# Patient Record
Sex: Male | Born: 1955 | Race: White | Hispanic: No
Health system: Midwestern US, Community
[De-identification: ages and names within clinical notes are randomized; demographics above are authoritative.]

## PROBLEM LIST (undated history)

## (undated) DIAGNOSIS — R739 Hyperglycemia, unspecified: Secondary | ICD-10-CM

---

## 2014-09-17 ENCOUNTER — Ambulatory Visit
Admit: 2014-09-17 | Discharge: 2014-09-17 | Payer: PRIVATE HEALTH INSURANCE | Attending: Physician Assistant | Primary: Physician Assistant

## 2014-09-17 DIAGNOSIS — I10 Essential (primary) hypertension: Secondary | ICD-10-CM

## 2014-09-17 MED ORDER — LOSARTAN POTASSIUM 25 MG PO TABS
25 MG | ORAL_TABLET | Freq: Every day | ORAL | 0 refills | Status: DC
Start: 2014-09-17 — End: 2014-10-22

## 2014-09-17 MED ORDER — BLOOD PRESSURE KIT
PACK | Freq: Every day | 0 refills | Status: AC
Start: 2014-09-17 — End: ?

## 2014-09-17 NOTE — Progress Notes (Signed)
[place]  [place] [address]  Dept: [phone]  Dept Fax: [phone]    Isaac Henson is a 59 y.o. male who presents today for his medical conditions/complaints as noted below.  Isaac Henson is c/o of   Chief Complaint   Patient presents with   ??? Hypertension   ??? Establish Care     new patient     Have you changed or stopped any medications since your last visit including any over-the-counter medicines, vitamins, or herbal medicines? no     Are you taking all your prescribed medications? No          If NO, why? -  Not taking any    Have you seen any other physician or provider since your last visit no    Have you had any other diagnostic tests since your last visit? no    Tobacco use:  Patient  reports that he has been smoking.  He has a 30.00 pack-year smoking history. He does not have any smokeless tobacco history on file.   If a smoker, cessation materials provided? NA   1-800-QUIT-NOW 5165463233)     Medical history Review  Past Medical, Family, and Social History reviewed and does contribute to the patient presenting condition    There are no preventive care reminders to display for this patient.      HPI:     Hypertension   This is a new problem. The current episode started more than 1 month ago. The problem is unchanged. The problem is uncontrolled. Associated symptoms include headaches. Pertinent negatives include no anxiety, blurred vision, chest pain, malaise/fatigue, neck pain, palpitations, peripheral edema, PND, shortness of breath or sweats. Past treatments include nothing.       No results found for: LABA1C          ( goal A1C is < 7)   No results found for: LABMICR  No results found for: LDLCHOLESTEROL, LDLCALC    (goal LDL is <100)   No results found for: AST, ALT, BUN  BP Readings from Last 3 Encounters:   09/17/14 (!) 161/110          (goal 120/80)    History reviewed. No pertinent past medical history.    Past Surgical History   Procedure Laterality Date   ??? Appendectomy         Family History   Problem Relation Age of Onset   ??? Asthma Mother    ??? Other Mother    ??? Diabetes Mother    ??? High Blood Pressure Mother    ??? Heart Disease Paternal Grandfather        Social History   Substance Use Topics   ??? Smoking status: Current Every Day Smoker     Packs/day: 1.00     Years: 30.00   ??? Smokeless tobacco: Not on file   ??? Alcohol use Yes      Comment: bottle of liquor a week      Current Outpatient Prescriptions   Medication Sig Dispense Refill   ??? losartan (COZAAR) 25 MG tablet Take 1 tablet by mouth daily 30 tablet 0   ??? Blood Pressure KIT 1 Device by Does not apply route daily 1 kit 0     No current facility-administered medications for this visit.      No Known Allergies    Health Maintenance   Topic Date Due   ???  COLON CANCER SCREENING COLONOSCOPY  11/16/2014 (Originally 11/23/2007)   ??? LIPIDS  11/22/2014 (Originally 11/22/1997)   ??? Tetanus (DTaP/Tdap/Td) (1 - Tdap) 11/22/2014 (Originally 11/22/1976)   ??? Flu Vaccine (1) 11/22/2014 (Originally 09/10/2014)   ??? DIABETES SCREENING  11/23/2014 (Originally 11/22/1997)   ??? PNEUMOVAX 1 DOSE 19-64Y (1) 11/29/2014 (Originally 11/23/1975)   ??? HIV SCREENING  11/29/2014 (Originally 11/22/1972)   ??? HEPATITIS  C SCREENING  11/30/2014 (Originally 05-25-1955)       Subjective:      Review of Systems   Constitutional: Negative for chills, diaphoresis, fatigue, fever and malaise/fatigue.   HENT: Negative for congestion, ear discharge, ear pain, postnasal drip, rhinorrhea, sinus pressure and sore throat.    Eyes: Negative for blurred vision, discharge and itching.   Respiratory: Negative for cough, chest tightness and shortness of breath.    Cardiovascular: Negative for chest pain, palpitations, leg swelling and PND.   Gastrointestinal: Negative for abdominal pain, diarrhea, nausea and vomiting.   Genitourinary: Negative for dysuria and frequency.   Musculoskeletal: Negative for neck pain  and neck stiffness.   Skin: Negative for rash.   Neurological: Positive for headaches. Negative for dizziness, weakness, light-headedness and numbness.   All other systems reviewed and are negative.      Objective:     Physical Exam   Constitutional: He is oriented to person, place, and time. He appears well-developed and well-nourished. No distress.   BP (!) 161/110  Pulse 96  Temp 99.1 ??F (37.3 ??C) (Oral)   Ht 6' (1.829 m) Comment: per pt  Wt 259 lb 14.4 oz (117.9 kg)  SpO2 96%  BMI 35.25 kg/m2     HENT:   Head: Normocephalic and atraumatic.   Right Ear: External ear normal.   Left Ear: External ear normal.   Nose: Nose normal.   Mouth/Throat: Oropharynx is clear and moist.   Eyes: Conjunctivae and EOM are normal. Pupils are equal, round, and reactive to light. Right eye exhibits no discharge. Left eye exhibits no discharge. No scleral icterus.   Neck: Normal range of motion. Neck supple. No tracheal deviation present. No thyromegaly present.   Cardiovascular: Normal rate, regular rhythm and normal heart sounds.  Exam reveals no gallop and no friction rub.    No murmur heard.  Pulmonary/Chest: Effort normal and breath sounds normal. No stridor. No respiratory distress. He has no wheezes. He has no rales. He exhibits no tenderness.   Abdominal: Soft. Bowel sounds are normal. He exhibits no distension. There is no tenderness. There is no rebound and no guarding.   Musculoskeletal: He exhibits no edema.   Neurological: He is alert and oriented to person, place, and time. Gait normal.   Skin: Skin is warm and dry. No rash noted. He is not diaphoretic.   Psychiatric: He has a normal mood and affect. His affect is not inappropriate.   Nursing note and vitals reviewed.    Visit Vitals   ??? BP (!) 161/110   ??? Pulse 96   ??? Temp 99.1 ??F (37.3 ??C) (Oral)   ??? Ht 6' (1.829 m)  Comment: per pt   ??? Wt 259 lb 14.4 oz (117.9 kg)   ??? SpO2 96%   ??? BMI 35.25 kg/m2       Assessment:      1. Uncontrolled hypertension  CBC Auto  Differential    Comprehensive Metabolic Panel    Lipid Panel    TSH with Reflex    Insulin, Total  XR Chest Standard TWO VW   2. BMI 35.0-35.9,adult  CBC Auto Differential    Comprehensive Metabolic Panel    Lipid Panel    TSH with Reflex    Insulin, Total   3. Screening  CBC Auto Differential    Comprehensive Metabolic Panel    Lipid Panel    TSH with Reflex    Insulin, Total   4. Family history of diabetes mellitus  CBC Auto Differential    Comprehensive Metabolic Panel    Lipid Panel    TSH with Reflex    Insulin, Total   5. Weight gain  CBC Auto Differential    Comprehensive Metabolic Panel    Lipid Panel    TSH with Reflex    Insulin, Total   6. Screening for prostate cancer  PSA Screening   7. Screening for colon cancer  AFL Derrel Nip MD- [place]field Cornea, MD   8. Tobacco abuse  XR Chest Standard TWO VW             Plan:      No Follow-up on file.    Orders Placed This Encounter   Procedures   ??? XR Chest Standard TWO VW     Standing Status:   Future     Standing Expiration Date:   09/17/2015   ??? Pneumococcal conjugate vaccine 13-valent IM (PREVNAR 13)   ??? CBC Auto Differential     Standing Status:   Future     Standing Expiration Date:   09/17/2015   ??? Comprehensive Metabolic Panel     Standing Status:   Future     Standing Expiration Date:   09/17/2015   ??? Lipid Panel     Standing Status:   Future     Standing Expiration Date:   09/17/2015     Order Specific Question:   Is Patient Fasting?/# of Hours     Answer:   yes   ??? TSH with Reflex     Standing Status:   Future     Standing Expiration Date:   09/17/2015   ??? Insulin, Total     Standing Status:   Future     Standing Expiration Date:   09/17/2015   ??? PSA Screening     Standing Status:   Future     Standing Expiration Date:   09/17/2015   ??? AFL Derrel Nip MD- [place] Cornea, MD     Referral Priority:   Routine     Referral Type:   Consult for Advice and Opinion     Referral Reason:   Specialty Services Required     Referred to Provider:   Derrel Nip, MD      Requested Specialty:   General Surgery     Number of Visits Requested:   1     Orders Placed This Encounter   Medications   ??? losartan (COZAAR) 25 MG tablet     Sig: Take 1 tablet by mouth daily     Dispense:  30 tablet     Refill:  0   ??? Blood Pressure KIT     Sig: 1 Device by Does not apply route daily     Dispense:  1 kit     Refill:  0     Adult upper arm automatic.    Dx: Uncontrolled HTN      Prefers to be called Ray.  Here from [place].  Caring for mother with dementia.  HTN - No hx of.  BP high today and at visit with dentist.      Weight gain - States not as active.  22 lbs over the last year.  Diet and exercise encouraged. Labs     Tobacco abuse - 1ppd x 27 years.  Understands risks.  Cxr ordered.    Colonoscopy - ordered.    Balanced diet and routine exercise encouraged.    Multivitamin with vitamin D daily encouraged.    Good sleep hygiene encouraged.    Dental exam q 6 mo.    Vision yearly.    Sunscreen encouraged.    Immunizations reviewed.    Prevnar 13 ordered.    Tetanus - Needs, but we are out of.     Patient given educational materials - see patient instructions.  Discussed use, benefit, and side effects of prescribed medications.  All patient questions answered. Pt voiced understanding. Reviewed health maintenance.  Instructed to continue current medications, diet and exercise.  Patient agreed with treatment plan. Follow up as directed.     Electronically signed by Sindy Messing, PA-C on 09/17/2014 at 2:12 PM

## 2014-09-17 NOTE — Progress Notes (Signed)
After obtaining consent, and per orders of Dr. Colbert EwingBurgoon, injection of Prevnar 13 given in left deltiod by Carley HammedJessica Loney Domingo. Patient instructed to remain in clinic for 20 minutes afterwards, and to report any adverse reaction to me immediately. Patient did not have a reaction at this time.

## 2014-09-21 ENCOUNTER — Ambulatory Visit: Admit: 2014-09-21 | Discharge: 2014-11-13 | Payer: PRIVATE HEALTH INSURANCE | Primary: Physician Assistant

## 2014-09-21 ENCOUNTER — Inpatient Hospital Stay: Attending: Physician Assistant | Primary: Physician Assistant

## 2014-09-21 DIAGNOSIS — I10 Essential (primary) hypertension: Secondary | ICD-10-CM

## 2014-09-21 LAB — CBC WITH AUTO DIFFERENTIAL
Basophils %: 0 % (ref 0–2)
Basophils Absolute: 0 10*3/uL (ref 0.0–0.2)
Eosinophils %: 2 % (ref 1–4)
Eosinophils Absolute: 0.1 10*3/uL (ref 0.0–0.4)
Hematocrit: 46.6 % (ref 41–53)
Hemoglobin: 15.3 g/dL (ref 13.5–17.5)
Lymphocytes Absolute: 1.1 10*3/uL (ref 1.0–4.8)
Lymphocytes: 14 % — ABNORMAL LOW (ref 24–44)
MCH: 31.8 pg (ref 26–34)
MCHC: 32.9 g/dL (ref 31–37)
MCV: 96.8 fL (ref 80–100)
MPV: 9.5 fL (ref 6.0–12.0)
Monocytes %: 6 % (ref 2–11)
Monocytes Absolute: 0.4 10*3/uL (ref 0.1–1.2)
Neutrophils Absolute: 5.7 10*3/uL (ref 1.8–7.7)
Platelets: 162 10*3/uL (ref 140–450)
RBC: 4.82 m/uL (ref 4.5–5.9)
RDW: 13.7 % (ref 12.5–15.4)
Seg Neutrophils: 78 % — ABNORMAL HIGH (ref 36–66)
WBC: 7.3 10*3/uL (ref 3.5–11.0)

## 2014-09-21 LAB — LIPID PANEL
Chol/HDL Ratio: 5 — ABNORMAL HIGH (ref <5)
Cholesterol: 209 mg/dL — ABNORMAL HIGH (ref <200)
HDL: 42 mg/dL (ref >40)
LDL Cholesterol: 125 mg/dL (ref 0–130)
Triglycerides: 209 mg/dL — ABNORMAL HIGH (ref <150)

## 2014-09-21 LAB — COMPREHENSIVE METABOLIC PANEL
ALT: 28 U/L (ref 5–41)
AST: 22 U/L (ref <40)
Albumin/Globulin Ratio: 1.4 (ref 1.0–2.5)
Albumin: 4.2 g/dL (ref 3.5–5.2)
Alkaline Phosphatase: 85 U/L (ref 40–129)
Anion Gap: 16 mmol/L (ref 9–17)
BUN: 14 mg/dL (ref 6–20)
CO2: 24 mmol/L (ref 20–31)
Calcium: 9.2 mg/dL (ref 8.6–10.4)
Chloride: 100 mmol/L (ref 98–107)
Creatinine: 1 mg/dL (ref 0.70–1.20)
GFR African American: >60 mL/min (ref >60)
GFR Non-African American: >60 mL/min (ref >60)
Glucose: 162 mg/dL — ABNORMAL HIGH (ref 70–99)
Potassium: 4 mmol/L (ref 3.7–5.3)
Sodium: 140 mmol/L (ref 135–144)
Total Bilirubin: 0.32 mg/dL (ref 0.3–1.2)
Total Protein: 7.3 g/dL (ref 6.4–8.3)

## 2014-09-21 LAB — INSULIN, TOTAL: Insulin: 54.3 mU/L

## 2014-09-21 LAB — TSH WITH REFLEX: TSH: 1.75 mIU/L (ref 0.30–5.00)

## 2014-09-21 LAB — PSA SCREENING: PSA: 2.06 ug/L (ref <4.1)

## 2014-10-01 ENCOUNTER — Ambulatory Visit
Admit: 2014-10-01 | Discharge: 2014-10-01 | Payer: PRIVATE HEALTH INSURANCE | Attending: Physician Assistant | Primary: Physician Assistant

## 2014-10-01 DIAGNOSIS — I10 Essential (primary) hypertension: Secondary | ICD-10-CM

## 2014-10-01 LAB — POCT GLYCOSYLATED HEMOGLOBIN (HGB A1C): Hemoglobin A1C: 5.8 %

## 2014-10-01 MED ORDER — FREESTYLE SYSTEM KIT
PACK | Freq: Every day | 0 refills | Status: AC
Start: 2014-10-01 — End: ?

## 2014-10-01 MED ORDER — LANCET DEVICE MISC
11 refills | Status: AC
Start: 2014-10-01 — End: ?

## 2014-10-01 MED ORDER — METFORMIN HCL ER 500 MG PO TB24
500 MG | ORAL_TABLET | Freq: Every day | ORAL | 3 refills | Status: DC
Start: 2014-10-01 — End: 2014-11-16

## 2014-10-01 MED ORDER — GLUCOSE BLOOD VI STRP
ORAL_STRIP | 11 refills | Status: AC
Start: 2014-10-01 — End: ?

## 2014-10-01 MED ORDER — FREESTYLE SYSTEM KIT
PACK | Freq: Every day | 0 refills | Status: DC
Start: 2014-10-01 — End: 2014-10-01

## 2014-10-01 NOTE — Progress Notes (Signed)
[place]  [place] [address]  Dept: [phone]  Dept Fax: [phone]    Isaac Henson is a 59 y.o. male who presents today for his medical conditions/complaints as noted below.  Isaac Henson is c/o of   Chief Complaint   Patient presents with   ??? Medication Check   ??? Discuss Labs     Have you changed or stopped any medications since your last visit including any over-the-counter medicines, vitamins, or herbal medicines? no     Are you taking all your prescribed medications? Yes          If NO, why? -  N/A    Have you seen any other physician or provider since your last visit no    Have you had any other diagnostic tests since your last visit? no    Tobacco use:  Patient  reports that he has been smoking.  He has a 30.00 pack-year smoking history. He does not have any smokeless tobacco history on file.   If a smoker, cessation materials provided? NA   1-800-QUIT-NOW [phone])     Medical history Review  Past Medical, Family, and Social History reviewed and does contribute to the patient presenting condition    There are no preventive care reminders to display for this patient.      HPI:     Hypertension   This is a new problem. The current episode started 1 to 4 weeks ago. Pertinent negatives include no anxiety, blurred vision, chest pain, headaches, malaise/fatigue, neck pain, orthopnea, palpitations, peripheral edema, PND, shortness of breath or sweats.   Diabetes   He presents for his follow-up diabetic visit. There are no hypoglycemic associated symptoms. Pertinent negatives for hypoglycemia include no dizziness, headaches or sweats. There are no diabetic associated symptoms. Pertinent negatives for diabetes include no blurred vision, no chest pain, no fatigue and no weakness.       HEMOGLOBIN A1C (%)   Date Value   10/01/2014 5.8             ( goal A1C is < 7)   No results found for: LABMICR  LDL CHOLESTEROL (mg/dL)    Date Value   09/21/2014 125       (goal LDL is <100)   AST (U/L)   Date Value   09/21/2014 22     ALT (U/L)   Date Value   09/21/2014 28     BUN (mg/dL)   Date Value   09/21/2014 14     BP Readings from Last 3 Encounters:   10/01/14 138/86   09/17/14 (!) 161/110          (goal 120/80)    History reviewed. No pertinent past medical history.   Past Surgical History   Procedure Laterality Date   ??? Appendectomy         Family History   Problem Relation Age of Onset   ??? Asthma Mother    ??? Other Mother    ??? Diabetes Mother    ??? High Blood Pressure Mother    ??? Heart Disease Paternal Grandfather        Social History   Substance Use Topics   ??? Smoking status: Current Every Day Smoker     Packs/day: 1.00     Years: 30.00   ??? Smokeless tobacco: Not on file   ??? Alcohol use Yes      Comment:  bottle of liquor a week      Current Outpatient Prescriptions   Medication Sig Dispense Refill   ??? metFORMIN ER (GLUCOPHAGE-XR) 500 MG XR tablet Take 1 tablet by mouth daily (with breakfast) 30 tablet 3   ??? glucose blood VI test strips (ASCENSIA AUTODISC VI;ONE TOUCH ULTRA TEST VI) strip Use with associated glucose meter.    Dx: Hyperglycemia/insulin resistance 100 strip 11   ??? Lancet Device MISC 100 each by Does not apply route continuous May use any brand    Dx: Hyperglycemia/insulin resistance 100 each 11   ??? glucose monitoring kit (FREESTYLE) monitoring kit 1 kit by Does not apply route daily Insulin resistance 1 kit 0   ??? losartan (COZAAR) 25 MG tablet Take 1 tablet by mouth daily 30 tablet 0   ??? Blood Pressure KIT 1 Device by Does not apply route daily 1 kit 0     No current facility-administered medications for this visit.      No Known Allergies    Health Maintenance   Topic Date Due   ??? COLON CANCER SCREENING COLONOSCOPY  11/16/2014 (Originally 11/23/2007)   ??? Flu Vaccine (1) 11/22/2014 (Originally 09/10/2014)   ??? PNEUMOVAX 1 DOSE 19-64Y (1) 11/29/2014 (Originally 11/23/1975)   ??? HIV SCREENING  11/29/2014 (Originally 11/22/1972)    ??? HEPATITIS  C SCREENING  11/30/2014 (Originally 1955-05-24)   ??? DIABETES SCREENING  09/30/2017   ??? LIPIDS  09/21/2019   ??? Tetanus (DTaP/Tdap/Td) (2 - Td) 09/30/2024       Subjective:      Review of Systems   Constitutional: Negative for chills, diaphoresis, fatigue, fever and malaise/fatigue.   HENT: Negative for congestion, ear discharge, ear pain, postnasal drip, rhinorrhea, sinus pressure and sore throat.    Eyes: Negative for blurred vision, discharge and itching.   Respiratory: Negative for cough, chest tightness and shortness of breath.    Cardiovascular: Negative for chest pain, palpitations, orthopnea, leg swelling and PND.   Gastrointestinal: Negative for abdominal pain, diarrhea, nausea and vomiting.   Genitourinary: Negative for dysuria and frequency.   Musculoskeletal: Negative for neck pain and neck stiffness.   Skin: Negative for rash.   Neurological: Negative for dizziness, weakness, light-headedness, numbness and headaches.   All other systems reviewed and are negative.      Objective:     Physical Exam   Constitutional: He is oriented to person, place, and time. He appears well-developed and well-nourished. No distress.   BP 138/86  Pulse 83  Temp 98.4 ??F (36.9 ??C) (Oral)   Ht 6' (1.829 m) Comment: per pt  Wt 260 lb 14.4 oz (118.3 kg)  SpO2 97%  BMI 35.38 kg/m2     HENT:   Head: Normocephalic and atraumatic.   Right Ear: External ear normal.   Left Ear: External ear normal.   Nose: Nose normal.   Mouth/Throat: Oropharynx is clear and moist.   Eyes: Conjunctivae and EOM are normal. Pupils are equal, round, and reactive to light. Right eye exhibits no discharge. Left eye exhibits no discharge. No scleral icterus.   Neck: Normal range of motion. Neck supple. No tracheal deviation present. No thyromegaly present.   Cardiovascular: Normal rate, regular rhythm and normal heart sounds.  Exam reveals no gallop and no friction rub.    No murmur heard.  Pulmonary/Chest: Effort normal and breath sounds  normal. No stridor. No respiratory distress. He has no wheezes. He has no rales. He exhibits no tenderness.   Abdominal: Soft. Bowel  sounds are normal. He exhibits no distension. There is no tenderness. There is no rebound and no guarding.   Musculoskeletal: He exhibits no edema.   Neurological: He is alert and oriented to person, place, and time. Gait normal.   Skin: Skin is warm and dry. No rash noted. He is not diaphoretic.   Psychiatric: He has a normal mood and affect. His affect is not inappropriate.   Nursing note and vitals reviewed.    Visit Vitals   ??? BP 138/86   ??? Pulse 83   ??? Temp 98.4 ??F (36.9 ??C) (Oral)   ??? Ht 6' (1.829 m)  Comment: per pt   ??? Wt 260 lb 14.4 oz (118.3 kg)   ??? SpO2 97%   ??? BMI 35.38 kg/m2       Assessment:      1. Uncontrolled hypertension     2. BMI 35.0-35.9,adult     3. Hyperglycemia  POCT glycosylated hemoglobin (Hb A1C)    Internal Referral Diabetes Education - [employer]   4. Need for diphtheria-tetanus-pertussis (Tdap) vaccine     5. Insulin resistance     6. Tobacco abuse  CT Chest W Contrast   7. Abnormal x-ray  CT Chest W Contrast             Plan:      No Follow-up on file.    Orders Placed This Encounter   Procedures   ??? CT Chest W Contrast     Standing Status:   Future     Standing Expiration Date:   10/01/2015   ??? Tdap vaccine greater than or equal to 7yo IM   ??? Internal Referral Diabetes Education - [facility]     Referral Priority:   Routine     Referral Type:   Consult for Advice and Opinion     Referral Reason:   Specialty Services Required     Number of Visits Requested:   1   ??? POCT glycosylated hemoglobin (Hb A1C)     Orders Placed This Encounter   Medications   ??? metFORMIN ER (GLUCOPHAGE-XR) 500 MG XR tablet     Sig: Take 1 tablet by mouth daily (with breakfast)     Dispense:  30 tablet     Refill:  3   ??? glucose blood VI test strips (ASCENSIA AUTODISC VI;ONE TOUCH ULTRA TEST VI) strip     Sig: Use with associated glucose meter.    Dx: Hyperglycemia/insulin  resistance     Dispense:  100 strip     Refill:  11   ??? Lancet Device MISC     Sig: 100 each by Does not apply route continuous May use any brand    Dx: Hyperglycemia/insulin resistance     Dispense:  100 each     Refill:  11   ??? DISCONTD: glucose monitoring kit (FREESTYLE) monitoring kit     Sig: 1 kit by Does not apply route daily Dx: Hyperglycemia/insulin resistance     Dispense:  1 kit     Refill:  0   ??? glucose monitoring kit (FREESTYLE) monitoring kit     Sig: 1 kit by Does not apply route daily Insulin resistance     Dispense:  1 kit     Refill:  0     Prefers to be called Ray. Here from [place]. Caring for mother with dementia.  ??  Labs from 09/21/14 reviewed.    HTN - Taking Cozaar and tolerating  well.  Cont med.  ??  Hyperglycemia/insulin resistance - BS 162, insulin 54, A1C 5.8.  Diet and exercise encouraged.  Check BS.  Start metformin.  SE discussed.  Add statin next visit.  DM supplies ordered.    Weight gain - States not as active. 22 lbs over the last year. Diet and exercise encouraged. Labs   ??  Tobacco abuse - 1ppd x 27 years. Understands risks. Cxr 09/21/14 shows nonspecific linear opacity at left lung base likely atelectasis scarring with other etiologies not excluded.  CT chest ordered.  ??  Colonoscopy - Scheduled.  ??  Balanced diet and routine exercise encouraged.  ??  Multivitamin with vitamin D daily encouraged.  ??  Good sleep hygiene encouraged.  ??  Dental exam q 6 mo.  ??  Vision yearly.  ??  Sunscreen encouraged.  ??  Immunizations reviewed.  ??  Prevnar 13 09-2014  ??  Tetanus - 09/2014      Patient given educational materials - see patient instructions.  Discussed use, benefit, and side effects of prescribed medications.  All patient questions answered. Pt voiced understanding. Reviewed health maintenance.  Instructed to continue current medications, diet and exercise.  Patient agreed with treatment plan. Follow up as directed.     Electronically signed by Sindy Messing, PA-C on 10/02/2014 at  9:39 PM

## 2014-10-18 NOTE — Telephone Encounter (Signed)
Patient needs a refill on his blood pressure medicine but he says that his blood pressure is still running high, he was wondering if he could get something stronger. Please advise.

## 2014-10-19 NOTE — Telephone Encounter (Signed)
Take 2 cozaar  then please schedule f/u if appt not already established

## 2014-10-19 NOTE — Telephone Encounter (Signed)
LVM ASKING PT TO CALL THE OFFICE BACK TO INFORM

## 2014-10-19 NOTE — Telephone Encounter (Signed)
Pt informed and he said that he is almost out of his medication

## 2014-10-22 MED ORDER — LOSARTAN POTASSIUM 50 MG PO TABS
50 MG | ORAL_TABLET | Freq: Every day | ORAL | 1 refills | Status: DC
Start: 2014-10-22 — End: 2014-11-16

## 2014-10-22 NOTE — Telephone Encounter (Signed)
Health Maintenance   Topic Date Due   ??? COLON CANCER SCREENING COLONOSCOPY  11/16/2014 (Originally 11/23/2007)   ??? Flu Vaccine (1) 11/22/2014 (Originally 09/10/2014)   ??? PNEUMOVAX 1 DOSE 19-64Y (1) 11/29/2014 (Originally 11/23/1975)   ??? HIV SCREENING  11/29/2014 (Originally 11/22/1972)   ??? HEPATITIS  C SCREENING  11/30/2014 (Originally 1955-09-13)   ??? DIABETES SCREENING  09/30/2017   ??? LIPIDS  09/21/2019   ??? DTaP/Tdap/Td vaccine (2 - Td) 09/30/2024       HEMOGLOBIN A1C (%)   Date Value   10/01/2014 5.8             ( goal A1C is < 7)   No results found for: LABMICR  LDL CHOLESTEROL (mg/dL)   Date Value   16/10/960408/01/2015 125       (goal LDL is <100)   AST (U/L)   Date Value   09/21/2014 22     ALT (U/L)   Date Value   09/21/2014 28     BUN (mg/dL)   Date Value   54/09/811908/01/2015 14     BP Readings from Last 3 Encounters:   10/01/14 138/86   09/17/14 (!) 161/110          (goal 120/80)    All Future Testing planned in [facility]  Lab Frequency Next Occurrence   CT Chest W Contrast Once 04/15/2015       Next Visit Date:  Future Appointments  Date Time Provider Department Center   10/26/2014 11:00 AM Lenox Pondsobert K Morris, MD [facility] None   11/02/2014 2:15 PM Lonny Prudeebecca M Burgoon, PA-C [facility]            Patient Active Problem List:     Uncontrolled hypertension     BMI 35.0-35.9,adult     Family history of diabetes mellitus     Weight gain     Screening for prostate cancer     Screening for colon cancer     Tobacco abuse

## 2014-10-22 NOTE — Telephone Encounter (Signed)
Refill request sent to you. Thanks!

## 2014-10-22 NOTE — Telephone Encounter (Signed)
Please send refill request for 

## 2014-10-25 MED ORDER — LACTATED RINGERS IV SOLN
INTRAVENOUS | Status: DC
Start: 2014-10-25 — End: 2014-11-01
  Administered 2014-10-26: 13:00:00 via INTRAVENOUS

## 2014-10-25 MED ORDER — MIDAZOLAM HCL 2 MG/2ML IJ SOLN
2 MG/ML | INTRAMUSCULAR | Status: DC | PRN
Start: 2014-10-25 — End: 2014-11-01

## 2014-10-25 MED ORDER — LIDOCAINE HCL (PF) 1 % IJ SOLN
1 % | Freq: Once | INTRAMUSCULAR | Status: AC | PRN
Start: 2014-10-25 — End: 2014-10-25

## 2014-10-26 ENCOUNTER — Inpatient Hospital Stay: Attending: Surgery | Primary: Physician Assistant

## 2014-10-26 ENCOUNTER — Inpatient Hospital Stay: Admit: 2014-10-26 | Attending: Surgery | Primary: Physician Assistant

## 2014-10-26 LAB — POC GLUCOSE FINGERSTICK: POC Glucose: 127 mg/dL — ABNORMAL HIGH (ref 75–110)

## 2014-10-26 NOTE — Op Note (Signed)
[facility]    Date 10/26/14    Patient: Isaac Henson DOB 12-Jul-2055    Pre op Diag: screening    Procedure: colonoscopy polypectomy x2    Postop Diag: 2 polyps    Anesthesia MAC    The patient was placed on his left side. A rectal exam was done that showed no masses. The scope was passed under direct vision through the rectum and sigmoid colon. The prep was good.There was a polyp at 70 cm that was removed with a biopsy forceps.The scope was then passed across the transverse colon, down the right colon to the cecum. The scope was then slowly withdrawn checking all the walls of the bowel a second time. There was a polyp in the right colon that was removed with a snare.. The scope was flexed in the rectum. There were no significant hemorrhoids. The patient tolerated the procedure well.    I recommend a repeat colonoscopy in 5 years.    Chales Salmon. Elizah Lydon MD.    Copy to Nani Gasser PA-C

## 2014-10-26 NOTE — Anesthesia Post-Procedure Evaluation (Signed)
POST-OP ANESTHESIA NOTE       Visit Vitals   ??? BP (!) 136/95   ??? Pulse 78   ??? Temp 98.1 ??F (36.7 ??C) (Temporal)   ??? Resp 16   ??? Ht 6' (1.829 m)   ??? Wt 255 lb (115.7 kg)   ??? SpO2 95%   ??? BMI 34.58 kg/m2      Pain Assessment: 0-10  Pain Level: 0     Type of Anesthesia:  General   MAC    Local     Epidural     Other___tiva_______         Pain Adequately Treated:   Yes       No       Nausea / Vomiting:     Present       Absent       Hydration:    Adequate     Not Adequate____________________________________       Cardiopulmonary Status: Stable with Patent Airway-    Yes     Other____________        Mental Status/LOC:   Awake   Sedated but [place]    Deeply Sedated       Complications:  None        Follow Up Care: None      Other/Instructions: After Sedation, do not make any important legal decisions, drive or operate dangerous machinery for the next 24 hours.       Electronically signed by Cristy Hilts, MD on 10/26/2014 at 11:31 AM

## 2014-10-26 NOTE — Anesthesia Pre-Procedure Evaluation (Signed)
Department of Anesthesiology  Preprocedure Note       Name:  Isaac Henson   Age:  59 y.o.  DOB:  1955/12/13                                          MRN:  [id number]         Date:  10/26/2014      Surgeon:    Procedure: colonoscopy    Medications prior to admission:   Prior to Admission medications    Medication Sig Start Date End Date Taking? Authorizing Provider   losartan (COZAAR) 50 MG tablet Take 1 tablet by mouth daily 10/22/14  Yes Verdene Lennert Burgoon, PA-C   metFORMIN ER (GLUCOPHAGE-XR) 500 MG XR tablet Take 1 tablet by mouth daily (with breakfast) 10/01/14  Yes Verdene Lennert Burgoon, PA-C   glucose blood VI test strips (ASCENSIA AUTODISC VI;ONE TOUCH ULTRA TEST VI) strip Use with associated glucose meter.    Dx: Hyperglycemia/insulin resistance 10/01/14   Sindy Messing, PA-C   Lancet Device MISC 100 each by Does not apply route continuous May use any brand    Dx: Hyperglycemia/insulin resistance 10/01/14   Sindy Messing, PA-C   glucose monitoring kit (FREESTYLE) monitoring kit 1 kit by Does not apply route daily Insulin resistance 10/01/14   Sindy Messing, PA-C   Blood Pressure KIT 1 Device by Does not apply route daily 09/17/14   Sindy Messing, PA-C       Current medications:    Current Outpatient Prescriptions   Medication Sig Dispense Refill   ??? losartan (COZAAR) 50 MG tablet Take 1 tablet by mouth daily 30 tablet 1   ??? metFORMIN ER (GLUCOPHAGE-XR) 500 MG XR tablet Take 1 tablet by mouth daily (with breakfast) 30 tablet 3   ??? glucose blood VI test strips (ASCENSIA AUTODISC VI;ONE TOUCH ULTRA TEST VI) strip Use with associated glucose meter.    Dx: Hyperglycemia/insulin resistance 100 strip 11   ??? Lancet Device MISC 100 each by Does not apply route continuous May use any brand    Dx: Hyperglycemia/insulin resistance 100 each 11   ??? glucose monitoring kit (FREESTYLE) monitoring kit 1 kit by Does not apply route daily Insulin resistance 1 kit 0   ??? Blood Pressure KIT 1 Device by Does not apply route  daily 1 kit 0     Current Facility-Administered Medications   Medication Dose Route Frequency Provider Last Rate Last Dose   ??? lactated ringers infusion   Intravenous Continuous Ilda Basset, MD 50 mL/hr at 10/26/14 0905     ??? midazolam (VERSED) injection 1 mg  1 mg Intravenous Q5 Min PRN Ilda Basset, MD           Allergies:  No Known Allergies    Problem List:    Patient Active Problem List   Diagnosis Code   ??? Uncontrolled hypertension I10   ??? BMI 35.0-35.9,adult Z68.35   ??? Family history of diabetes mellitus Z83.3   ??? Weight gain R63.5   ??? Screening for prostate cancer Z12.5   ??? Screening for colon cancer Z12.11   ??? Tobacco abuse Z72.0       Past Medical History:        Diagnosis Date   ??? Hypertension        Past Surgical History:  Procedure Laterality Date   ??? Appendectomy     ??? Other surgical history Right 10/26/2014     right ear fx, sx       Social History:    Social History   Substance Use Topics   ??? Smoking status: Current Every Day Smoker     Packs/day: 1.00     Years: 30.00   ??? Smokeless tobacco: Not on file   ??? Alcohol use Yes      Comment: bottle of liquor a week                                Ready to quit: Not Answered  Counseling given: Not Answered      Vital Signs (Current):   Vitals:    10/26/14 0851   BP: (!) 157/102   Pulse: 89   Resp: 18   Temp: 97 ??F (36.1 ??C)   TempSrc: Temporal   SpO2: 94%   Weight: 255 lb (115.7 kg)   Height: 6' (1.829 m)                                              BP Readings from Last 3 Encounters:   10/26/14 (!) 157/102   10/01/14 138/86   09/17/14 (!) 161/110       NPO Status: Time of last liquid consumption: 2255                        Time of last solid consumption: 2200                        Date of last liquid consumption: 10/25/14                        Date of last solid food consumption: 10/24/14    BMI:   Wt Readings from Last 3 Encounters:   10/26/14 255 lb (115.7 kg)   10/01/14 260 lb 14.4 oz (118.3 kg)   09/17/14 259 lb 14.4 oz (117.9 kg)     Body  mass index is 34.58 kg/(m^2).    Anesthesia Evaluation   no history of anesthetic complications:   Airway: Mallampati: II     Neck ROM: full  Mouth opening: > = 3 FB Dental:          Pulmonary:       (-) COPD, asthma, shortness of breath, recent URI, sleep apnea and stridor       Cardiovascular:    (+) hypertension:,     (-) pacemaker, past MI, CABG/stent,  angina and  CHF        Rate: normal                 Neuro/Psych:      (-) seizures and CVA  GI/Hepatic/Renal:   (+) bowel prep     (-) hepatitis and liver disease     Endo/Other:    (+) Type II DM, ,     (-) hypothyroidism, hyperthyroidism    Abdominal:       Abdomen: soft.             Anesthesia Plan    ASA 2     MAC and TIVA  intravenous induction   Anesthetic plan and risks discussed with patient (male companion).            Ilda Basset, MD   10/26/2014

## 2014-10-30 LAB — SURGICAL PATHOLOGY

## 2014-11-02 ENCOUNTER — Ambulatory Visit
Admit: 2014-11-02 | Discharge: 2014-11-02 | Payer: PRIVATE HEALTH INSURANCE | Attending: Physician Assistant | Primary: Physician Assistant

## 2014-11-02 DIAGNOSIS — I10 Essential (primary) hypertension: Secondary | ICD-10-CM

## 2014-11-02 MED ORDER — TRIAMCINOLONE ACETONIDE 40 MG/ML IJ SUSP
40 MG/ML | Freq: Once | INTRAMUSCULAR | Status: AC
Start: 2014-11-02 — End: 2014-11-02
  Administered 2014-11-02: 19:00:00 40 mg via INTRAMUSCULAR

## 2014-11-02 MED ORDER — AMOXICILLIN 500 MG PO CAPS
500 MG | ORAL_CAPSULE | Freq: Two times a day (BID) | ORAL | 0 refills | Status: AC
Start: 2014-11-02 — End: 2014-11-12

## 2014-11-02 MED ORDER — AMLODIPINE BESYLATE 5 MG PO TABS
5 MG | ORAL_TABLET | Freq: Every day | ORAL | 0 refills | Status: DC
Start: 2014-11-02 — End: 2014-11-16

## 2014-11-02 MED ORDER — ALBUTEROL SULFATE HFA 108 (90 BASE) MCG/ACT IN AERS
108 (90 Base) MCG/ACT | Freq: Four times a day (QID) | RESPIRATORY_TRACT | 3 refills | Status: DC | PRN
Start: 2014-11-02 — End: 2015-02-01

## 2014-11-02 NOTE — Progress Notes (Signed)
After obtaining consent, and per orders of Dr. Colbert Ewing, injection of kenalog 40 given in right gluteal by Caniya Tagle Campti-Anders. Patient instructed to remain in clinic for 20 minutes afterwards, and to report any adverse reaction to me immediately. No reaction at this time

## 2014-11-02 NOTE — Progress Notes (Signed)
Isaac Henson [place] [address]  Dept: [phone]  Dept Fax: [phone]    Isaac Henson is a 59 y.o. male who presents today for his medical conditions/complaints as noted below.  Isaac Henson is c/o of   Chief Complaint   Patient presents with   ??? Hypertension   ??? Medication Check     pt states that the blood pressure medication that he is on is not working well     Have you changed or stopped any medications since your last visit including any over-the-counter medicines, vitamins, or herbal medicines? no     Are you taking all your prescribed medications? Yes          If NO, why? -  N/A    Have you seen any other physician or provider since your last visit yes - colonoscopy    Have you had any other diagnostic tests since your last visit? no    Tobacco use:  Patient  reports that he has been smoking.  He has a 30.00 pack-year smoking history. He does not have any smokeless tobacco history on file.   If a smoker, cessation materials provided? NA   1-800-QUIT-NOW 2520709681)     Medical history Review  Past Medical, Family, and Social History reviewed and does contribute to the patient presenting condition    Health Maintenance   Topic Date Due   ??? Flu Vaccine (1) 11/22/2014 (Originally 09/10/2014)   ??? PNEUMOVAX 1 DOSE 19-64Y (1) 11/29/2014 (Originally 11/23/1975)   ??? HIV SCREENING  11/29/2014 (Originally 11/22/1972)   ??? HEPATITIS  C SCREENING  11/30/2014 (Originally 12/02/1955)   ??? DIABETES SCREENING  09/30/2017   ??? LIPIDS  09/21/2019   ??? COLON CANCER SCREENING COLONOSCOPY  10/26/2019   ??? DTaP/Tdap/Td vaccine (2 - Td) 09/30/2024                     HPI:     Hypertension   This is a chronic problem. The current episode started more than 1 year ago. The problem is unchanged. The problem is uncontrolled. Pertinent negatives include no anxiety, blurred vision, chest pain, headaches, malaise/fatigue, neck pain, orthopnea,  palpitations, peripheral edema, PND or shortness of breath. Risk factors for coronary artery disease include obesity and male gender. The current treatment provides mild improvement.   Cough   This is a new problem. The cough is non-productive. Associated symptoms include a sore throat. Pertinent negatives include no chest pain, chills, ear pain, fever, headaches, postnasal drip, rash, rhinorrhea or shortness of breath.       HEMOGLOBIN A1C (%)   Date Value   10/01/2014 5.8             ( goal A1C is < 7)   No results found for: LABMICR  LDL CHOLESTEROL (mg/dL)   Date Value   09/21/2014 125       (goal LDL is <100)   AST (U/L)   Date Value   09/21/2014 22     ALT (U/L)   Date Value   09/21/2014 28     BUN (mg/dL)   Date Value   09/21/2014 14     BP Readings from Last 3 Encounters:   11/02/14 (!) 150/101   10/26/14 (!) 136/95   10/01/14 138/86          (goal 120/80)    Past Medical History   Diagnosis  Date   ??? Hypertension       Past Surgical History   Procedure Laterality Date   ??? Appendectomy     ??? Other surgical history Right 10/26/2014     right ear fx, sx   ??? Colonoscopy  10/26/2014       Family History   Problem Relation Age of Onset   ??? Asthma Mother    ??? Other Mother    ??? Diabetes Mother    ??? High Blood Pressure Mother    ??? Heart Disease Paternal Grandfather        Social History   Substance Use Topics   ??? Smoking status: Current Every Day Smoker     Packs/day: 1.00     Years: 30.00   ??? Smokeless tobacco: Not on file   ??? Alcohol use Yes      Comment: bottle of liquor a week      Current Outpatient Prescriptions   Medication Sig Dispense Refill   ??? amLODIPine (NORVASC) 5 MG tablet Take 1 tablet by mouth daily 30 tablet 0   ??? amoxicillin (AMOXIL) 500 MG capsule Take 1 capsule by mouth 2 times daily for 10 days 20 capsule 0   ??? albuterol sulfate HFA (PROAIR HFA) 108 (90 BASE) MCG/ACT inhaler Inhale 2 puffs into the lungs every 6 hours as needed for Wheezing 1 Inhaler 3   ??? losartan (COZAAR) 50 MG tablet Take 1  tablet by mouth daily 30 tablet 1   ??? metFORMIN ER (GLUCOPHAGE-XR) 500 MG XR tablet Take 1 tablet by mouth daily (with breakfast) 30 tablet 3   ??? glucose blood VI test strips (ASCENSIA AUTODISC VI;ONE TOUCH ULTRA TEST VI) strip Use with associated glucose meter.    Dx: Hyperglycemia/insulin resistance 100 strip 11   ??? Lancet Device MISC 100 each by Does not apply route continuous May use any brand    Dx: Hyperglycemia/insulin resistance 100 each 11   ??? glucose monitoring kit (FREESTYLE) monitoring kit 1 kit by Does not apply route daily Insulin resistance 1 kit 0   ??? Blood Pressure KIT 1 Device by Does not apply route daily 1 kit 0     Current Facility-Administered Medications   Medication Dose Route Frequency Provider Last Rate Last Dose   ??? triamcinolone acetonide (KENALOG-40) injection 40 mg  40 mg Intramuscular Once Sindy Messing, PA-C         No Known Allergies    Health Maintenance   Topic Date Due   ??? Flu Vaccine (1) 11/22/2014 (Originally 09/10/2014)   ??? PNEUMOVAX 1 DOSE 19-64Y (1) 11/29/2014 (Originally 11/23/1975)   ??? HIV SCREENING  11/29/2014 (Originally 11/22/1972)   ??? HEPATITIS  C SCREENING  11/30/2014 (Originally 08/29/1955)   ??? DIABETES SCREENING  09/30/2017   ??? LIPIDS  09/21/2019   ??? COLON CANCER SCREENING COLONOSCOPY  10/26/2019   ??? DTaP/Tdap/Td vaccine (2 - Td) 09/30/2024       Subjective:      Review of Systems   Constitutional: Negative for chills, diaphoresis, fatigue, fever and malaise/fatigue.   HENT: Positive for sore throat. Negative for congestion, ear discharge, ear pain, postnasal drip, rhinorrhea and sinus pressure.    Eyes: Negative for blurred vision, discharge and itching.   Respiratory: Negative for cough, chest tightness and shortness of breath.    Cardiovascular: Negative for chest pain, palpitations, orthopnea, leg swelling and PND.   Gastrointestinal: Negative for abdominal pain, diarrhea, nausea and vomiting.   Genitourinary: Negative for dysuria  and frequency.    Musculoskeletal: Negative for neck pain and neck stiffness.   Skin: Negative for rash.   Neurological: Negative for dizziness, weakness, light-headedness, numbness and headaches.   All other systems reviewed and are negative.      Objective:     Physical Exam   Constitutional: He is oriented to person, place, and time. He appears well-developed and well-nourished. No distress.   BP (!) 150/101  Pulse 93  Temp 98.2 ??F (36.8 ??C) (Oral)   Ht 6' (1.829 m) Comment: per pt  Wt 256 lb 11.2 oz (116.4 kg)  SpO2 97%  BMI 34.81 kg/m2     HENT:   Head: Normocephalic and atraumatic.   Right Ear: External ear normal.   Left Ear: External ear normal.   Nose: Nose normal.   Mouth/Throat: Oropharynx is clear and moist.   Eyes: Conjunctivae and EOM are normal. Pupils are equal, round, and reactive to light. Right eye exhibits no discharge. Left eye exhibits no discharge. No scleral icterus.   Neck: Normal range of motion. Neck supple. No tracheal deviation present. No thyromegaly present.   Cardiovascular: Normal rate, regular rhythm and normal heart sounds.  Exam reveals no gallop and no friction rub.    No murmur heard.  Pulmonary/Chest: Effort normal. No stridor. No respiratory distress. He has wheezes. He has no rales. He exhibits no tenderness.   Abdominal: Soft. Bowel sounds are normal. He exhibits no distension. There is no tenderness. There is no rebound and no guarding.   Musculoskeletal: He exhibits no edema.   Neurological: He is alert and oriented to person, place, and time. Gait normal.   Skin: Skin is warm and dry. No rash noted. He is not diaphoretic.   Psychiatric: He has a normal mood and affect. His affect is not inappropriate.   Nursing note and vitals reviewed.    Visit Vitals   ??? BP (!) 150/101   ??? Pulse 93   ??? Temp 98.2 ??F (36.8 ??C) (Oral)   ??? Ht 6' (1.829 m)  Comment: per pt   ??? Wt 256 lb 11.2 oz (116.4 kg)   ??? SpO2 97%   ??? BMI 34.81 kg/m2       Assessment:      1. Uncontrolled hypertension     2. Upper  respiratory tract infection, unspecified type     3. Bronchitis               Plan:      No Follow-up on file.    No orders of the defined types were placed in this encounter.    Orders Placed This Encounter   Medications   ??? amLODIPine (NORVASC) 5 MG tablet     Sig: Take 1 tablet by mouth daily     Dispense:  30 tablet     Refill:  0   ??? amoxicillin (AMOXIL) 500 MG capsule     Sig: Take 1 capsule by mouth 2 times daily for 10 days     Dispense:  20 capsule     Refill:  0   ??? albuterol sulfate HFA (PROAIR HFA) 108 (90 BASE) MCG/ACT inhaler     Sig: Inhale 2 puffs into the lungs every 6 hours as needed for Wheezing     Dispense:  1 Inhaler     Refill:  3   ??? triamcinolone acetonide (KENALOG-40) injection 40 mg      Prefers to be called Ray. Here from [place]. Caring for  mother with dementia.  ????  Labs from 09/21/14 reviewed.  ??  HTN - Taking Cozaar and tolerating well. Cont med.  BP still have  ????  Hyperglycemia/insulin resistance - BS 162, insulin 54, A1C 5.8. Diet and exercise encouraged. Check BS. Start metformin. SE discussed. Add statin next visit. DM supplies ordered.  ??  Weight gain - States not as active. 22 lbs over the last year. Diet and exercise encouraged. Labs   ????  Tobacco abuse - 1ppd x 27 years. Understands risks. Cxr 09/21/14 shows nonspecific linear opacity at left lung base likely atelectasis scarring with other etiologies not excluded. CT chest ordered.  ????  Colonoscopy - 10/26/14.  2 polyps removed.  Repeat 5 yrs.  ????  Balanced diet and routine exercise encouraged.  ????  Multivitamin with vitamin D daily encouraged.  ????  Good sleep hygiene encouraged.  ????  Dental exam q 6 mo.  ????  Vision yearly.  ????  Sunscreen encouraged.  ????  Immunizations reviewed.  ????  Prevnar 13 09/2014  ????  Tetanus - 09/2014       Patient given educational materials - see patient instructions.  Discussed use, benefit, and side effects of prescribed medications.  All patient questions answered. Pt voiced understanding. Reviewed  health maintenance.  Instructed to continue current medications, diet and exercise.  Patient agreed with treatment plan. Follow up as directed.     Electronically signed by Sindy Messing, PA-C on 11/02/2014 at 2:57 PM

## 2014-11-06 NOTE — Telephone Encounter (Signed)
i called the pharmacy about the freestyle system kit prior auth. They said they will contact the insurance to see what is covered

## 2014-11-16 ENCOUNTER — Ambulatory Visit
Admit: 2014-11-16 | Discharge: 2014-11-16 | Payer: PRIVATE HEALTH INSURANCE | Attending: Physician Assistant | Primary: Physician Assistant

## 2014-11-16 DIAGNOSIS — I10 Essential (primary) hypertension: Secondary | ICD-10-CM

## 2014-11-16 MED ORDER — AMLODIPINE BESYLATE 5 MG PO TABS
5 MG | ORAL_TABLET | Freq: Every day | ORAL | 3 refills | Status: DC
Start: 2014-11-16 — End: 2014-11-23

## 2014-11-16 MED ORDER — LOSARTAN POTASSIUM 50 MG PO TABS
50 MG | ORAL_TABLET | Freq: Every day | ORAL | 3 refills | Status: DC
Start: 2014-11-16 — End: 2014-12-28

## 2014-11-16 MED ORDER — ZOSTER VACCINE LIVE 19400 UNT/0.65ML SC SOLR
19400 UNT/0.65ML | Freq: Once | SUBCUTANEOUS | 0 refills | Status: AC
Start: 2014-11-16 — End: 2014-11-16

## 2014-11-16 MED ORDER — METFORMIN HCL ER 500 MG PO TB24
500 MG | ORAL_TABLET | Freq: Every day | ORAL | 3 refills | Status: DC
Start: 2014-11-16 — End: 2014-12-28

## 2014-11-16 NOTE — Progress Notes (Signed)
[facility]  [address]  [place] [address]  Dept: [phone]  Dept Fax: [phone]    Isaac Henson is a 59 y.o. male who presents today for his medical conditions/complaints as noted below.  Isaac Henson is c/o of   Chief Complaint   Patient presents with   ??? Hypertension   ??? Medication Check     Have you changed or stopped any medications since your last visit including any over-the-counter medicines, vitamins, or herbal medicines? no     Are you taking all your prescribed medications? Yes          If NO, why? -  N/A    Have you seen any other physician or provider since your last visit no    Have you had any other diagnostic tests since your last visit? no    Tobacco use:  Patient  reports that he has been smoking.  He has a 30.00 pack-year smoking history. He does not have any smokeless tobacco history on file.   If a smoker, cessation materials provided? NA   1-800-QUIT-NOW [phone])     Medical history Review  Past Medical, Family, and Social History reviewed and does contribute to the patient presenting condition    Health Maintenance   Topic Date Due   ??? Flu vaccine (1) 11/22/2014 (Originally 09/10/2014)   ??? PNEUMOVAX 1 DOSE 19-64Y (1) 11/29/2014 (Originally 11/23/1975)   ??? HIV screen  11/29/2014 (Originally 11/22/1972)   ??? Hepatitis C screen  11/30/2014 (Originally 1956-01-24)   ??? Diabetes screen  09/30/2017   ??? Lipid screen  09/21/2019   ??? Colon cancer screen colonoscopy  10/26/2019   ??? DTaP/Tdap/Td vaccine (2 - Td) 09/30/2024                     HPI:     Hypertension   This is a chronic problem. The current episode started more than 1 year ago. The problem is unchanged. Pertinent negatives include no anxiety, blurred vision, chest pain, headaches, malaise/fatigue, neck pain, orthopnea, palpitations, peripheral edema, PND, shortness of breath or sweats.   Diabetes   He presents for his follow-up diabetic visit. He has type 2  diabetes mellitus. Pertinent negatives for hypoglycemia include no dizziness, headaches or sweats. There are no diabetic associated symptoms. Pertinent negatives for diabetes include no blurred vision, no chest pain, no fatigue and no weakness. There are no hypoglycemic complications. There are no diabetic complications.       HEMOGLOBIN A1C (%)   Date Value   10/01/2014 5.8             ( goal A1C is < 7)   No results found for: LABMICR  LDL CHOLESTEROL (mg/dL)   Date Value   63/18/6662 125       (goal LDL is <100)   AST (U/L)   Date Value   09/21/2014 22     ALT (U/L)   Date Value   09/21/2014 28     BUN (mg/dL)   Date Value   88/35/5142 14     BP Readings from Last 3 Encounters:   11/16/14 141/90   11/02/14 (!) 150/101   10/26/14 (!) 136/95          (goal 120/80)    Past Medical History   Diagnosis Date   ??? Hypertension       Past Surgical History   Procedure Laterality Date   ???  Appendectomy     ??? Other surgical history Right 10/26/2014     right ear fx, sx   ??? Colonoscopy  10/26/2014       Family History   Problem Relation Age of Onset   ??? Asthma Mother    ??? Other Mother    ??? Diabetes Mother    ??? High Blood Pressure Mother    ??? Heart Disease Paternal Grandfather        Social History   Substance Use Topics   ??? Smoking status: Current Every Day Smoker     Packs/day: 1.00     Years: 30.00   ??? Smokeless tobacco: Not on file   ??? Alcohol use Yes      Comment: bottle of liquor a week      Current Outpatient Prescriptions   Medication Sig Dispense Refill   ??? losartan (COZAAR) 50 MG tablet Take 1 tablet by mouth daily 30 tablet 3   ??? amLODIPine (NORVASC) 5 MG tablet Take 1 tablet by mouth daily 30 tablet 3   ??? metFORMIN (GLUCOPHAGE-XR) 500 MG extended release tablet Take 1 tablet by mouth daily (with breakfast) 30 tablet 3   ??? zoster vaccine live, PF, (ZOSTAVAX) 24268 UNT/0.65ML injection Inject 0.65 mLs into the skin once for 1 dose 0.65 mL 0   ??? albuterol sulfate HFA (PROAIR HFA) 108 (90 BASE) MCG/ACT inhaler Inhale 2  puffs into the lungs every 6 hours as needed for Wheezing 1 Inhaler 3   ??? glucose blood VI test strips (ASCENSIA AUTODISC VI;ONE TOUCH ULTRA TEST VI) strip Use with associated glucose meter.    Dx: Hyperglycemia/insulin resistance 100 strip 11   ??? Lancet Device MISC 100 each by Does not apply route continuous May use any brand    Dx: Hyperglycemia/insulin resistance 100 each 11   ??? glucose monitoring kit (FREESTYLE) monitoring kit 1 kit by Does not apply route daily Insulin resistance 1 kit 0   ??? Blood Pressure KIT 1 Device by Does not apply route daily 1 kit 0     No current facility-administered medications for this visit.      No Known Allergies    Health Maintenance   Topic Date Due   ??? Flu vaccine (1) 11/22/2014 (Originally 09/10/2014)   ??? PNEUMOVAX 1 DOSE 19-64Y (1) 11/29/2014 (Originally 11/23/1975)   ??? HIV screen  11/29/2014 (Originally 11/22/1972)   ??? Hepatitis C screen  11/30/2014 (Originally 07-Jul-1955)   ??? Diabetes screen  09/30/2017   ??? Lipid screen  09/21/2019   ??? Colon cancer screen colonoscopy  10/26/2019   ??? DTaP/Tdap/Td vaccine (2 - Td) 09/30/2024       Subjective:      Review of Systems   Constitutional: Negative for chills, diaphoresis, fatigue, fever and malaise/fatigue.   HENT: Negative for congestion, ear discharge, ear pain, postnasal drip, rhinorrhea, sinus pressure and sore throat.    Eyes: Negative for blurred vision, discharge and itching.   Respiratory: Negative for cough, chest tightness and shortness of breath.    Cardiovascular: Negative for chest pain, palpitations, orthopnea, leg swelling and PND.   Gastrointestinal: Negative for abdominal pain, diarrhea, nausea and vomiting.   Genitourinary: Negative for dysuria and frequency.   Musculoskeletal: Negative for neck pain and neck stiffness.   Skin: Negative for rash.   Neurological: Negative for dizziness, weakness, light-headedness, numbness and headaches.   All other systems reviewed and are negative.      Objective:     Physical Exam  Constitutional: He is oriented to person, place, and time. He appears well-developed and well-nourished. No distress.   BP 141/90  Pulse 75  Temp 97.6 ??F (36.4 ??C) (Oral)   Ht 6' (1.829 m) Comment: per pt  Wt 252 lb (114.3 kg)  SpO2 98%  BMI 34.18 kg/m2     HENT:   Head: Normocephalic and atraumatic.   Right Ear: External ear normal.   Left Ear: External ear normal.   Nose: Nose normal.   Mouth/Throat: Oropharynx is clear and moist.   Eyes: Conjunctivae and EOM are normal. Pupils are equal, round, and reactive to light. Right eye exhibits no discharge. Left eye exhibits no discharge. No scleral icterus.   Neck: Normal range of motion. Neck supple. No tracheal deviation present. No thyromegaly present.   Cardiovascular: Normal rate, regular rhythm and normal heart sounds.  Exam reveals no gallop and no friction rub.    No murmur heard.  Pulmonary/Chest: Effort normal and breath sounds normal. No stridor. No respiratory distress. He has no wheezes. He has no rales. He exhibits no tenderness.   Abdominal: Soft. Bowel sounds are normal. He exhibits no distension. There is no tenderness. There is no rebound and no guarding.   Musculoskeletal: He exhibits no edema.   Neurological: He is alert and oriented to person, place, and time. Gait normal.   Skin: Skin is warm and dry. No rash noted. He is not diaphoretic.   Psychiatric: He has a normal mood and affect. His affect is not inappropriate.   Nursing note and vitals reviewed.    Visit Vitals   ??? BP 141/90   ??? Pulse 75   ??? Temp 97.6 ??F (36.4 ??C) (Oral)   ??? Ht 6' (1.829 m)  Comment: per pt   ??? Wt 252 lb (114.3 kg)   ??? SpO2 98%   ??? BMI 34.18 kg/m2       Assessment:      1. Uncontrolled hypertension     2. Tobacco abuse     3. Insulin resistance  Basic Metabolic Panel    Insulin, Total    Hemoglobin A1C   4. Hyperlipidemia, unspecified hyperlipidemia type     5. Need for shingles vaccine               Plan:      No Follow-up on file.    Orders Placed This Encounter    Procedures   ??? Basic Metabolic Panel     Standing Status:   Future     Standing Expiration Date:   11/16/2015   ??? Insulin, Total     Standing Status:   Future     Standing Expiration Date:   11/16/2015   ??? Hemoglobin A1C     Standing Status:   Future     Standing Expiration Date:   11/16/2015     Orders Placed This Encounter   Medications   ??? losartan (COZAAR) 50 MG tablet     Sig: Take 1 tablet by mouth daily     Dispense:  30 tablet     Refill:  3   ??? amLODIPine (NORVASC) 5 MG tablet     Sig: Take 1 tablet by mouth daily     Dispense:  30 tablet     Refill:  3   ??? metFORMIN (GLUCOPHAGE-XR) 500 MG extended release tablet     Sig: Take 1 tablet by mouth daily (with breakfast)     Dispense:  30 tablet  Refill:  3   ??? zoster vaccine live, PF, (ZOSTAVAX) [id number] UNT/0.65ML injection     Sig: Inject 0.65 mLs into the skin once for 1 dose     Dispense:  0.65 mL     Refill:  0        Prefers to be called Isaac Henson. Here from [place]. Caring for mother with dementia.  ????  Labs from 09/21/14 reviewed.  ????  HTN - Taking Cozaar and tolerating well. Cont med. BP still have.  Started norvasc.  Tolerated well.    ????  Hyperglycemia/insulin resistance - BS 162, insulin 54, A1C 5.8. Diet and exercise encouraged. Check BS. Start metformin. SE discussed. Add statin next visit. DM supplies ordered.  ????  Hyperlipidemia - Mildly elevated.  Diet and exercise encouraged.    Weight gain - States not as active. 22 lbs over the last year. Diet and exercise encouraged. Labs   ????  Tobacco abuse - 1ppd x 27 years. Understands risks. Cxr 09/21/14 shows nonspecific linear opacity at left lung base likely atelectasis scarring with other etiologies not excluded. CT chest ordered.  ????  Colonoscopy - 10/26/14. 2 polyps removed. Repeat 5 yrs.  ????  Balanced diet and routine exercise encouraged.  ????  Multivitamin with vitamin D daily encouraged.  ????  Good sleep hygiene encouraged.  ????  Dental exam q 6 mo.  ????  Vision yearly.  ????  Sunscreen  encouraged.  ????  Immunizations reviewed.  ????  Prevnar 13 09/2014  ????  Tetanus - 09/2014  ??  Flu - Pharmacy 2016    Zostavax - rx given.       Patient given educational materials - see patient instructions.  Discussed use, benefit, and side effects of prescribed medications.  All patient questions answered. Pt voiced understanding. Reviewed health maintenance.  Instructed to continue current medications, diet and exercise.  Patient agreed with treatment plan. Follow up as directed.     Electronically signed by Sindy Messing, PA-C on 11/16/2014 at 12:18 PM

## 2014-11-23 ENCOUNTER — Ambulatory Visit
Admit: 2014-11-23 | Discharge: 2014-11-23 | Payer: PRIVATE HEALTH INSURANCE | Attending: Physician Assistant | Primary: Physician Assistant

## 2014-11-23 DIAGNOSIS — I10 Essential (primary) hypertension: Secondary | ICD-10-CM

## 2014-11-23 MED ORDER — AMLODIPINE BESYLATE 5 MG PO TABS
5 MG | ORAL_TABLET | Freq: Every day | ORAL | 3 refills | Status: DC
Start: 2014-11-23 — End: 2014-12-28

## 2014-11-23 MED ORDER — LOSARTAN POTASSIUM-HCTZ 100-25 MG PO TABS
100-25 MG | ORAL_TABLET | Freq: Every day | ORAL | 3 refills | Status: DC
Start: 2014-11-23 — End: 2014-12-28

## 2014-11-23 NOTE — Progress Notes (Signed)
[facility]  [place] [address]  Dept: [phone]  Dept Fax: [phone]    Isaac Henson is a 59 y.o. male who presents today for his medical conditions/complaints as noted below.  [name] is c/o of   Chief Complaint   Patient presents with   ??? Hypertension     readings been high   ??? Medication Check     Have you changed or stopped any medications since your last visit including any over-the-counter medicines, vitamins, or herbal medicines? no     Are you taking all your prescribed medications? Yes          If NO, why? -  N/A    Have you seen any other physician or provider since your last visit no    Have you had any other diagnostic tests since your last visit? no    Tobacco use:  Patient  reports that he has been smoking.  He has a 30.00 pack-year smoking history. He does not have any smokeless tobacco history on file.   If a smoker, cessation materials provided? NA   1-800-QUIT-NOW [phone])     Medical history Review  Past Medical, Family, and Social History reviewed and does contribute to the patient presenting condition    Health Maintenance   Topic Date Due   ??? PNEUMOVAX 1 DOSE 19-64Y (1) 11/29/2014 (Originally 11/23/1975)   ??? HIV screen  11/29/2014 (Originally 11/22/1972)   ??? Hepatitis C screen  11/30/2014 (Originally 1955/09/07)   ??? Flu vaccine (1) 02/07/2015 (Originally 09/10/2014)   ??? Diabetes screen  09/30/2017   ??? Lipid screen  09/21/2019   ??? Colon cancer screen colonoscopy  10/26/2019   ??? DTaP/Tdap/Td vaccine (2 - Td) 09/30/2024                   HPI:     Hypertension   This is a chronic problem. The current episode started more than 1 year ago. The problem is unchanged. Pertinent negatives include no anxiety, blurred vision, chest pain, headaches, malaise/fatigue, neck pain, orthopnea, palpitations, peripheral edema, PND, shortness of breath or sweats. There are no associated agents to hypertension. Risk  factors for coronary artery disease include obesity and male gender. Past treatments include calcium channel blockers and angiotensin blockers. The current treatment provides mild improvement. There is no history of angina, kidney disease, CAD/MI, CVA, heart failure, left ventricular hypertrophy, PVD, renovascular disease, retinopathy or a thyroid problem. There is no history of chronic renal disease, coarctation of the aorta, hyperaldosteronism, hypercortisolism, hyperparathyroidism, a hypertension causing med, pheochromocytoma or sleep apnea.       HEMOGLOBIN A1C (%)   Date Value   10/01/2014 5.8             ( goal A1C is < 7)   No results found for: LABMICR  LDL CHOLESTEROL (mg/dL)   Date Value   09/21/2014 125       (goal LDL is <100)   AST (U/L)   Date Value   09/21/2014 22     ALT (U/L)   Date Value   09/21/2014 28     BUN (mg/dL)   Date Value   09/21/2014 14     BP Readings from Last 3 Encounters:   11/23/14 (!) 153/101   11/16/14 141/90   11/02/14 (!) 150/101          (goal 120/80)    Past Medical  History   Diagnosis Date   ??? Hypertension       Past Surgical History   Procedure Laterality Date   ??? Appendectomy     ??? Other surgical history Right 10/26/2014     right ear fx, sx   ??? Colonoscopy  10/26/2014       Family History   Problem Relation Age of Onset   ??? Asthma Mother    ??? Other Mother    ??? Diabetes Mother    ??? High Blood Pressure Mother    ??? Heart Disease Paternal Grandfather        Social History   Substance Use Topics   ??? Smoking status: Current Every Day Smoker     Packs/day: 1.00     Years: 30.00   ??? Smokeless tobacco: Not on file   ??? Alcohol use Yes      Comment: bottle of liquor a week      Current Outpatient Prescriptions   Medication Sig Dispense Refill   ??? amLODIPine (NORVASC) 5 MG tablet Take 1 tablet by mouth daily 30 tablet 3   ??? losartan-hydrochlorothiazide (HYZAAR) 100-25 MG per tablet Take 1 tablet by mouth daily 30 tablet 3   ??? losartan (COZAAR) 50 MG tablet Take 1 tablet by mouth daily  30 tablet 3   ??? metFORMIN (GLUCOPHAGE-XR) 500 MG extended release tablet Take 1 tablet by mouth daily (with breakfast) 30 tablet 3   ??? albuterol sulfate HFA (PROAIR HFA) 108 (90 BASE) MCG/ACT inhaler Inhale 2 puffs into the lungs every 6 hours as needed for Wheezing 1 Inhaler 3   ??? glucose blood VI test strips (ASCENSIA AUTODISC VI;ONE TOUCH ULTRA TEST VI) strip Use with associated glucose meter.    Dx: Hyperglycemia/insulin resistance 100 strip 11   ??? Lancet Device MISC 100 each by Does not apply route continuous May use any brand    Dx: Hyperglycemia/insulin resistance 100 each 11   ??? glucose monitoring kit (FREESTYLE) monitoring kit 1 kit by Does not apply route daily Insulin resistance 1 kit 0   ??? Blood Pressure KIT 1 Device by Does not apply route daily 1 kit 0     No current facility-administered medications for this visit.      No Known Allergies    Health Maintenance   Topic Date Due   ??? PNEUMOVAX 1 DOSE 19-64Y (1) 11/29/2014 (Originally 11/23/1975)   ??? HIV screen  11/29/2014 (Originally 11/22/1972)   ??? Hepatitis C screen  11/30/2014 (Originally 1955/07/21)   ??? Flu vaccine (1) 02/07/2015 (Originally 09/10/2014)   ??? Diabetes screen  09/30/2017   ??? Lipid screen  09/21/2019   ??? Colon cancer screen colonoscopy  10/26/2019   ??? DTaP/Tdap/Td vaccine (2 - Td) 09/30/2024       Subjective:      Review of Systems   Constitutional: Negative for chills, diaphoresis, fatigue, fever and malaise/fatigue.   HENT: Negative for congestion, ear discharge, ear pain, postnasal drip, rhinorrhea, sinus pressure and sore throat.    Eyes: Negative for blurred vision, discharge and itching.   Respiratory: Negative for cough, chest tightness and shortness of breath.    Cardiovascular: Negative for chest pain, palpitations, orthopnea, leg swelling and PND.   Gastrointestinal: Negative for abdominal pain, diarrhea, nausea and vomiting.   Genitourinary: Negative for dysuria and frequency.   Musculoskeletal: Negative for neck pain and neck  stiffness.   Skin: Negative for rash.   Neurological: Negative for dizziness, weakness, light-headedness, numbness and headaches.  All other systems reviewed and are negative.      Objective:     Physical Exam   Constitutional: He is oriented to person, place, and time. He appears well-developed and well-nourished. No distress.   BP (!) 153/101  Pulse 98  Temp 98.1 ??F (36.7 ??C) (Oral)   Ht 6' (1.829 m) Comment: per pt  Wt 254 lb 3.2 oz (115.3 kg)  SpO2 94%  BMI 34.48 kg/m2     HENT:   Head: Normocephalic and atraumatic.   Right Ear: External ear normal.   Left Ear: External ear normal.   Nose: Nose normal.   Mouth/Throat: Oropharynx is clear and moist.   Eyes: Conjunctivae and EOM are normal. Pupils are equal, round, and reactive to light. Right eye exhibits no discharge. Left eye exhibits no discharge. No scleral icterus.   Neck: Normal range of motion. Neck supple. No tracheal deviation present. No thyromegaly present.   Cardiovascular: Normal rate, regular rhythm and normal heart sounds.  Exam reveals no gallop and no friction rub.    No murmur heard.  Pulmonary/Chest: Effort normal and breath sounds normal. No stridor. No respiratory distress. He has no wheezes. He has no rales. He exhibits no tenderness.   Abdominal: Soft. Bowel sounds are normal. He exhibits no distension. There is no tenderness. There is no rebound and no guarding.   Musculoskeletal: He exhibits no edema.   Neurological: He is alert and oriented to person, place, and time. Gait normal.   Skin: Skin is warm and dry. No rash noted. He is not diaphoretic.   Psychiatric: He has a normal mood and affect. His affect is not inappropriate.   Nursing note and vitals reviewed.    Visit Vitals   ??? BP (!) 153/101   ??? Pulse 98   ??? Temp 98.1 ??F (36.7 ??C) (Oral)   ??? Ht 6' (1.829 m)  Comment: per pt   ??? Wt 254 lb 3.2 oz (115.3 kg)   ??? SpO2 94%   ??? BMI 34.48 kg/m2       Assessment:      1. Uncontrolled hypertension               Plan:      No Follow-up on  file.    No orders of the defined types were placed in this encounter.    Orders Placed This Encounter   Medications   ??? amLODIPine (NORVASC) 5 MG tablet     Sig: Take 1 tablet by mouth daily     Dispense:  30 tablet     Refill:  3   ??? losartan-hydrochlorothiazide (HYZAAR) 100-25 MG per tablet     Sig: Take 1 tablet by mouth daily     Dispense:  30 tablet     Refill:  3      Prefers to be called Ray. Here from [place]. Caring for mother with dementia.  ????  Labs from 09/21/14 reviewed.  ????  HTN - Taking Cozaar and tolerating well. Cont med. BP still have. Started norvasc. Tolerated well. BP still high.  Increase losartan and add HTCZ  ????  Hyperglycemia/insulin resistance - BS 162, insulin 54, A1C 5.8. Diet and exercise encouraged. Check BS. Start metformin. SE discussed. Add statin next visit. DM supplies ordered.  ????  Hyperlipidemia - Mildly elevated. Diet and exercise encouraged.  ??  Weight gain - States not as active. 22 lbs over the last year. Diet and exercise encouraged. Labs   ????  Tobacco abuse - 1ppd x 27 years. Understands risks. Cxr 09/21/14 shows nonspecific linear opacity at left lung base likely atelectasis scarring with other etiologies not excluded. CT chest ordered.  ????  Colonoscopy - 10/26/14. 2 polyps removed. Repeat 5 yrs.  ????  Balanced diet and routine exercise encouraged.  ????  Multivitamin with vitamin D daily encouraged.  ????  Good sleep hygiene encouraged.  ????  Dental exam q 6 mo.  ????  Vision yearly.  ????  Sunscreen encouraged.  ????  Immunizations reviewed.  ????  Prevnar 13 09/2014  ????  Tetanus - 09/2014  ????  Flu - Pharmacy 2016  ??  Zostavax - rx given.  ??           Patient given educational materials - see patient instructions.  Discussed use, benefit, and side effects of prescribed medications.  All patient questions answered. Pt voiced understanding. Reviewed health maintenance.  Instructed to continue current medications, diet and exercise.  Patient agreed with treatment plan. Follow up as directed.      Electronically signed by Sindy Messing, PA-C on 11/23/2014 at 12:54 PM

## 2014-11-30 NOTE — Telephone Encounter (Signed)
lvm asking pt to call the office back. Per rebecca needs to start taking 10mg  of norvasac

## 2014-11-30 NOTE — Telephone Encounter (Signed)
He should be taking norvasc and hyzaar (losartan HCTZ)

## 2014-11-30 NOTE — Telephone Encounter (Signed)
PT called with his BP readings for this week    Monday - 159-95  Tuesday 158-89  Wed - [id number]  Thursday - 173-105  Friday - 152/96    He was wondering if he should just take the losartin 50mg  and the losartin 25mg  because his follow up said he should be taking both but he thought you said he should be taking 25mg  instead of the 50mg . He hasn't been taking the 50mg  this week has been taking the 25mg . What should he be taking? Please advise. Thank you

## 2014-12-19 NOTE — Telephone Encounter (Signed)
Read for today was 159/110.

## 2014-12-19 NOTE — Telephone Encounter (Signed)
Burnett KanarisYikes!  That's is high.  Stop both meds.  Start labetolol sent to pharmacy.  F/u 1 wk.  Call sooner for any adverse effects.

## 2014-12-19 NOTE — Telephone Encounter (Signed)
Pt called and states that he has been coming in for his BP checks every week since starting the new med and that it is getting higher. He is asking if he should be coming back in as an office visit to discuss, if you want to try another dose or brand of med or if he should come in for a lab/ma to check his BP? Please advise.   Health Maintenance   Topic Date Due   ??? Hepatitis C screen  1955-06-09   ??? HIV screen  11/22/1972   ??? PNEUMOVAX 1 DOSE 19-64Y (1) 11/23/1975   ??? Flu vaccine (1) 02/07/2015 (Originally 09/10/2014)   ??? Diabetes screen  09/30/2017   ??? Lipid screen  09/21/2019   ??? Colon cancer screen colonoscopy  10/26/2019   ??? DTaP/Tdap/Td vaccine (2 - Td) 09/30/2024       HEMOGLOBIN A1C (%)   Date Value   10/01/2014 5.8             ( goal A1C is < 7)   No results found for: LABMICR  LDL CHOLESTEROL (mg/dL)   Date Value   16/10/960408/01/2015 125       (goal LDL is <100)   AST (U/L)   Date Value   09/21/2014 22     ALT (U/L)   Date Value   09/21/2014 28     BUN (mg/dL)   Date Value   54/09/811908/01/2015 14     BP Readings from Last 3 Encounters:   11/23/14 (!) 153/101   11/16/14 141/90   11/02/14 (!) 150/101          (goal 120/80)    All Future Testing planned in [facility]  Lab Frequency Next Occurrence   CT Chest W Contrast Once 04/15/2015   Basic Metabolic Panel Once 05/27/2015   Insulin, Total Once 05/27/2015   Hemoglobin A1C Once 05/27/2015       Next Visit Date:  No future appointments.         Patient Active Problem List:     Uncontrolled hypertension     BMI 35.0-35.9,adult     Family history of diabetes mellitus     Weight gain     Screening for prostate cancer     Screening for colon cancer     Tobacco abuse     Insulin resistance     Hyperlipidemia

## 2014-12-19 NOTE — Telephone Encounter (Signed)
I called pt back and his BP reads are for during the week are  133/84  146/92  130/96  166/100  169/80  150/98  His read for toda was 159/110.

## 2014-12-19 NOTE — Telephone Encounter (Signed)
What have his reads been?

## 2014-12-20 MED ORDER — LABETALOL HCL 100 MG PO TABS
100 MG | ORAL_TABLET | Freq: Two times a day (BID) | ORAL | 0 refills | Status: DC
Start: 2014-12-20 — End: 2015-01-18

## 2014-12-20 NOTE — Telephone Encounter (Signed)
Pt called back and was advised. He is scheduled for an appt for 11/18.

## 2014-12-20 NOTE — Telephone Encounter (Signed)
lvm asking pt to call the office back to inform

## 2014-12-27 NOTE — Telephone Encounter (Signed)
I called for pt to find out if he had his labs done for this appt tomorrow. I asked his daughter to have pt call our office.

## 2014-12-28 ENCOUNTER — Ambulatory Visit
Admit: 2014-12-28 | Discharge: 2014-12-28 | Payer: PRIVATE HEALTH INSURANCE | Attending: Physician Assistant | Primary: Physician Assistant

## 2014-12-28 DIAGNOSIS — I10 Essential (primary) hypertension: Secondary | ICD-10-CM

## 2014-12-28 MED ORDER — LISINOPRIL 10 MG PO TABS
10 MG | ORAL_TABLET | Freq: Every day | ORAL | 3 refills | Status: DC
Start: 2014-12-28 — End: 2015-01-18

## 2014-12-28 MED ORDER — METFORMIN HCL ER 500 MG PO TB24
500 MG | ORAL_TABLET | Freq: Every day | ORAL | 3 refills | Status: AC
Start: 2014-12-28 — End: ?

## 2014-12-28 NOTE — Progress Notes (Signed)
[place]  [place] [address]  Dept: [phone]  Dept Fax: [phone]    Isaac Henson is a 59 y.o. male who presents today for his medical conditions/complaints as noted below.  [name] is c/o of   Chief Complaint   Patient presents with   ??? Hypertension     Have you changed or stopped any medications since your last visit including any over-the-counter medicines, vitamins, or herbal medicines? yes - most due to Dr. order     Are you taking all your prescribed medications? No          If NO, why? -  N/A    Have you seen any other physician or provider since your last visit no    Have you had any other diagnostic tests since your last visit? no    Tobacco use:  Patient  reports that he has been smoking.  He has a 30.00 pack-year smoking history. He does not have any smokeless tobacco history on file.   If a smoker, cessation materials provided? NA   1-800-QUIT-NOW [phone])     Medical history Review  Past Medical, Family, and Social History reviewed and does contribute to the patient presenting condition    Health Maintenance   Topic Date Due   ??? Hepatitis C screen  02/01/2015 (Originally 1956/01/12)   ??? Flu vaccine (1) 02/07/2015 (Originally 09/10/2014)   ??? PNEUMOVAX 1 DOSE 19-64Y (1) 03/08/2015 (Originally 11/23/1975)   ??? HIV screen  03/08/2015 (Originally 11/22/1972)   ??? Diabetes screen  09/30/2017   ??? Lipid screen  09/21/2019   ??? Colon cancer screen colonoscopy  10/26/2019   ??? DTaP/Tdap/Td vaccine (2 - Td) 09/30/2024                   HPI:     Hypertension   This is a new problem. The current episode started more than 1 month ago. The problem is uncontrolled. Pertinent negatives include no anxiety, blurred vision, chest pain, headaches, malaise/fatigue, neck pain, orthopnea, palpitations, peripheral edema, PND, shortness of breath or sweats. Risk factors for coronary artery disease include obesity, diabetes  mellitus and dyslipidemia. The current treatment provides moderate improvement.       HEMOGLOBIN A1C (%)   Date Value   10/01/2014 5.8             ( goal A1C is < 7)   No results found for: LABMICR  LDL CHOLESTEROL (mg/dL)   Date Value   09/21/2014 125       (goal LDL is <100)   AST (U/L)   Date Value   09/21/2014 22     ALT (U/L)   Date Value   09/21/2014 28     BUN (mg/dL)   Date Value   09/21/2014 14     BP Readings from Last 3 Encounters:   12/28/14 (!) 150/92   11/23/14 (!) 153/101   11/16/14 141/90          (goal 120/80)    Past Medical History   Diagnosis Date   ??? Hypertension       Past Surgical History   Procedure Laterality Date   ??? Appendectomy     ??? Other surgical history Right 10/26/2014     right ear fx, sx   ??? Colonoscopy  10/26/2014       Family History   Problem Relation Age of Onset   ???  Asthma Mother    ??? Other Mother    ??? Diabetes Mother    ??? High Blood Pressure Mother    ??? Heart Disease Paternal Grandfather        Social History   Substance Use Topics   ??? Smoking status: Current Every Day Smoker     Packs/day: 1.00     Years: 30.00   ??? Smokeless tobacco: Not on file   ??? Alcohol use Yes      Comment: bottle of liquor a week      Current Outpatient Prescriptions   Medication Sig Dispense Refill   ??? lisinopril (PRINIVIL;ZESTRIL) 10 MG tablet Take 1 tablet by mouth daily 30 tablet 3   ??? metFORMIN (GLUCOPHAGE-XR) 500 MG extended release tablet Take 1 tablet by mouth daily (with breakfast) 30 tablet 3   ??? labetalol (NORMODYNE) 100 MG tablet Take 1 tablet by mouth 2 times daily 60 tablet 0   ??? albuterol sulfate HFA (PROAIR HFA) 108 (90 BASE) MCG/ACT inhaler Inhale 2 puffs into the lungs every 6 hours as needed for Wheezing 1 Inhaler 3   ??? glucose blood VI test strips (ASCENSIA AUTODISC VI;ONE TOUCH ULTRA TEST VI) strip Use with associated glucose meter.    Dx: Hyperglycemia/insulin resistance 100 strip 11   ??? Lancet Device MISC 100 each by Does not apply route continuous May use any brand    Dx:  Hyperglycemia/insulin resistance 100 each 11   ??? glucose monitoring kit (FREESTYLE) monitoring kit 1 kit by Does not apply route daily Insulin resistance 1 kit 0   ??? Blood Pressure KIT 1 Device by Does not apply route daily 1 kit 0     No current facility-administered medications for this visit.      No Known Allergies    Health Maintenance   Topic Date Due   ??? Hepatitis C screen  02/01/2015 (Originally 1955-12-02)   ??? Flu vaccine (1) 02/07/2015 (Originally 09/10/2014)   ??? PNEUMOVAX 1 DOSE 19-64Y (1) 03/08/2015 (Originally 11/23/1975)   ??? HIV screen  03/08/2015 (Originally 11/22/1972)   ??? Diabetes screen  09/30/2017   ??? Lipid screen  09/21/2019   ??? Colon cancer screen colonoscopy  10/26/2019   ??? DTaP/Tdap/Td vaccine (2 - Td) 09/30/2024       Subjective:      Review of Systems   Constitutional: Negative for chills, diaphoresis, fatigue, fever and malaise/fatigue.   HENT: Negative for congestion, ear discharge, ear pain, postnasal drip, rhinorrhea, sinus pressure and sore throat.    Eyes: Negative for blurred vision, discharge and itching.   Respiratory: Negative for cough, chest tightness and shortness of breath.    Cardiovascular: Negative for chest pain, palpitations, orthopnea, leg swelling and PND.   Gastrointestinal: Negative for abdominal pain, diarrhea, nausea and vomiting.   Genitourinary: Negative for dysuria and frequency.   Musculoskeletal: Negative for neck pain and neck stiffness.   Skin: Negative for rash.   Neurological: Negative for dizziness, weakness, light-headedness, numbness and headaches.   All other systems reviewed and are negative.      Objective:     Physical Exam   Constitutional: He is oriented to person, place, and time. He appears well-developed and well-nourished. No distress.   BP (!) 150/92  Pulse 78  Temp 97.8 ??F (36.6 ??C) (Oral)   Ht 6' (1.829 m) Comment: per pt  Wt 241 lb (109.3 kg)  SpO2 96%  BMI 32.69 kg/m2     HENT:   Head: Normocephalic and  atraumatic.   Right Ear: External ear  normal.   Left Ear: External ear normal.   Nose: Nose normal.   Mouth/Throat: Oropharynx is clear and moist.   Eyes: Conjunctivae and EOM are normal. Pupils are equal, round, and reactive to light. Right eye exhibits no discharge. Left eye exhibits no discharge. No scleral icterus.   Neck: Normal range of motion. Neck supple. No tracheal deviation present. No thyromegaly present.   Cardiovascular: Normal rate, regular rhythm and normal heart sounds.  Exam reveals no gallop and no friction rub.    No murmur heard.  Pulmonary/Chest: Effort normal and breath sounds normal. No stridor. No respiratory distress. He has no wheezes. He has no rales. He exhibits no tenderness.   Abdominal: Soft. Bowel sounds are normal. He exhibits no distension. There is no tenderness. There is no rebound and no guarding.   Musculoskeletal: He exhibits no edema.   Neurological: He is alert and oriented to person, place, and time. Gait normal.   Skin: Skin is warm and dry. No rash noted. He is not diaphoretic.   Psychiatric: He has a normal mood and affect. His affect is not inappropriate.   Nursing note and vitals reviewed.    Visit Vitals   ??? BP (!) 150/92   ??? Pulse 78   ??? Temp 97.8 ??F (36.6 ??C) (Oral)   ??? Ht 6' (1.829 m)  Comment: per pt   ??? Wt 241 lb (109.3 kg)   ??? SpO2 96%   ??? BMI 32.69 kg/m2       Assessment:      1. Uncontrolled hypertension               Plan:      No Follow-up on file.    No orders of the defined types were placed in this encounter.    Orders Placed This Encounter   Medications   ??? lisinopril (PRINIVIL;ZESTRIL) 10 MG tablet     Sig: Take 1 tablet by mouth daily     Dispense:  30 tablet     Refill:  3   ??? metFORMIN (GLUCOPHAGE-XR) 500 MG extended release tablet     Sig: Take 1 tablet by mouth daily (with breakfast)     Dispense:  30 tablet     Refill:  3      Prefers to be called Isaac Henson. Here from [place]. Caring for mother with dementia.  ????  Labs from 09/21/14 reviewed.  ????  HTN - Taking Cozaar and tolerating well.  Cont med. BP still have. Started norvasc. Tolerated well. BP still high. Increase losartan and add HTCZ - Still high.  Med changed to labetolol.  BP somewhat better.  Add lisinopril.  ????  Hyperglycemia/insulin resistance - BS 162, insulin 54, A1C 5.8. Diet and exercise encouraged. Check BS. Start metformin. SE discussed. Add statin next visit. DM supplies ordered.  ????  Hyperlipidemia - Mildly elevated. Diet and exercise encouraged.  ????  Weight gain - States not as active. 22 lbs over the last year. Diet and exercise encouraged. Labs   ????  Tobacco abuse - 1ppd x 27 years. Understands risks. Cxr 09/21/14 shows nonspecific linear opacity at left lung base likely atelectasis scarring with other etiologies not excluded. CT chest ordered.  ????  Colonoscopy - 10/26/14. 2 polyps removed. Repeat 5 yrs.  ????  Balanced diet and routine exercise encouraged.  ????  Multivitamin with vitamin D daily encouraged.  ????  Good sleep hygiene encouraged.  ????  Dental exam q 6 mo.  ????  Vision yearly.  ????  Sunscreen encouraged.  ????  Immunizations reviewed.  ????  Prevnar 13 09/2014  ????  Tetanus - 09/2014  ????  Flu - Pharmacy 2016  ????  Zostavax - rx given.  ????  ??   Patient given educational materials - see patient instructions.  Discussed use, benefit, and side effects of prescribed medications.  All patient questions answered. Pt voiced understanding. Reviewed health maintenance.  Instructed to continue current medications, diet and exercise.  Patient agreed with treatment plan. Follow up as directed.     Electronically signed by Sindy Messing, PA-C on 12/28/2014 at 8:49 AM

## 2015-01-09 NOTE — Telephone Encounter (Signed)
Still high.  How does he feel on the med?

## 2015-01-09 NOTE — Telephone Encounter (Signed)
Pt is calling in his BP reads    145/99  153/85  166/100  160/88  182/105  145/88  148/90

## 2015-01-10 NOTE — Telephone Encounter (Signed)
LM for pt to call our office.

## 2015-01-10 NOTE — Telephone Encounter (Signed)
Confirm he is taking the lisinopril 10 mg.  Lets increase lisinopril to 20 mg.  He can take 2 of the 10 mg.  Call with BP reads monday

## 2015-01-10 NOTE — Telephone Encounter (Signed)
Pt called back and states he feels fine no problems with the medication... Tolerating well what is the next step to try and lower his blood pressure BP reading for today 159/103  No headaches or dizziness

## 2015-01-11 NOTE — Telephone Encounter (Signed)
Pt informed. Will call Monday with BP results

## 2015-01-14 NOTE — Telephone Encounter (Signed)
I called pt and advised. Pt would like to know if he still taking the Lisinopril 2 times a day with Lebetalol? Please advise. Thank you.

## 2015-01-14 NOTE — Telephone Encounter (Signed)
Bp reads    174/110 Saturday 12/3  190/105 Sunday morning 12/4  154/87 Sunday evening 12/4  174/113 This morning 12/5    Pt stated took a total of Lisinopril of 4 pills yesterday. 2 in the morning and 2 in the evening. Pt took 2 pills this morning and he stated he going to take another 2 this evening.

## 2015-01-14 NOTE — Telephone Encounter (Signed)
Too high.  Lets increase his labetalol to 200mg  BID

## 2015-01-15 NOTE — Telephone Encounter (Signed)
Still take 20 mg lisinopril daily

## 2015-01-15 NOTE — Telephone Encounter (Signed)
lvm asking pt to call the office back

## 2015-01-17 NOTE — Telephone Encounter (Signed)
lvm to continue with lisinopril 20mg  and to let us know if he has questions

## 2015-01-18 NOTE — Telephone Encounter (Signed)
Can you see what his BP is on labetalol 200 mg and lisinopril 20 mg?

## 2015-01-21 NOTE — Telephone Encounter (Signed)
Called and lvm to call us back and let us know how his BP is doing

## 2015-01-23 MED ORDER — LABETALOL HCL 200 MG PO TABS
200 MG | ORAL_TABLET | Freq: Two times a day (BID) | ORAL | 1 refills | Status: DC
Start: 2015-01-23 — End: 2015-04-18

## 2015-01-23 MED ORDER — LISINOPRIL 20 MG PO TABS
20 MG | ORAL_TABLET | Freq: Every day | ORAL | 3 refills | Status: AC
Start: 2015-01-23 — End: ?

## 2015-01-23 NOTE — Telephone Encounter (Signed)
Pt informed

## 2015-01-23 NOTE — Telephone Encounter (Signed)
Better.  Cont meds and keep checking BP

## 2015-01-23 NOTE — Telephone Encounter (Signed)
Reading 01-16-15 am- evening 146/78  163/107    12/8  150/97---pm 154/87  12/9  148/96   140-160    Reading are higher during the AM

## 2015-01-31 ENCOUNTER — Inpatient Hospital Stay: Attending: Physician Assistant | Primary: Physician Assistant

## 2015-01-31 DIAGNOSIS — E8881 Metabolic syndrome: Secondary | ICD-10-CM

## 2015-01-31 LAB — BASIC METABOLIC PANEL
Anion Gap: 12 mmol/L (ref 9–17)
BUN/Creatinine Ratio: 11 (ref 9–20)
BUN: 10 mg/dL (ref 6–20)
CO2: 29 mmol/L (ref 20–31)
Calcium: 9.1 mg/dL (ref 8.6–10.4)
Chloride: 96 mmol/L — ABNORMAL LOW (ref 98–107)
Creatinine: 0.93 mg/dL (ref 0.70–1.20)
GFR African American: >60 mL/min (ref >60)
GFR Non-African American: >60 mL/min (ref >60)
Glucose: 118 mg/dL — ABNORMAL HIGH (ref 70–99)
Potassium: 4.4 mmol/L (ref 3.7–5.3)
Sodium: 137 mmol/L (ref 135–144)

## 2015-01-31 LAB — INSULIN, TOTAL: Insulin: 10.5 mU/L

## 2015-01-31 NOTE — Telephone Encounter (Signed)
LM for pt to call our office. Did pt get his labs done for his appt tomorrow 12/23.

## 2015-02-01 ENCOUNTER — Ambulatory Visit
Admit: 2015-02-01 | Discharge: 2015-02-01 | Payer: PRIVATE HEALTH INSURANCE | Attending: Physician Assistant | Primary: Physician Assistant

## 2015-02-01 DIAGNOSIS — I10 Essential (primary) hypertension: Secondary | ICD-10-CM

## 2015-02-01 LAB — HEMOGLOBIN A1C
Estimated Avg Glucose: 120 mg/dL
Hemoglobin A1C: 5.8 % (ref 4.0–6.0)

## 2015-02-01 MED ORDER — ALBUTEROL SULFATE HFA 108 (90 BASE) MCG/ACT IN AERS
108 (90 Base) MCG/ACT | Freq: Four times a day (QID) | RESPIRATORY_TRACT | 3 refills | Status: AC | PRN
Start: 2015-02-01 — End: ?

## 2015-02-01 MED ORDER — MOMETASONE FURO-FORMOTEROL FUM 200-5 MCG/ACT IN AERO
200-5 MCG/ACT | Freq: Two times a day (BID) | RESPIRATORY_TRACT | 3 refills | Status: AC
Start: 2015-02-01 — End: ?

## 2015-02-01 MED ORDER — LISINOPRIL-HYDROCHLOROTHIAZIDE 20-25 MG PO TABS
20-25 MG | ORAL_TABLET | Freq: Every day | ORAL | 3 refills | Status: AC
Start: 2015-02-01 — End: ?

## 2015-02-01 NOTE — Progress Notes (Addendum)
[place]  [place] [address]  Dept: [phone]  Dept Fax: [phone]    Isaac Henson is a 59 y.o. male who presents today for his medical conditions/complaints as noted below.  [name] is c/o of   Chief Complaint   Patient presents with   ??? Hypertension   ??? Discuss Labs     pt states that he completed blood work     Visit Information    Have you changed or started any medications since your last visit including any over-the-counter medicines, vitamins, or herbal medicines? no   Have you stopped taking any of your medications? Is so, why? -  no  Are you having any side effects from any of your medications? - no    Have you seen any other physician or provider since your last visit?  no   Have you had any other diagnostic tests since your last visit?  no   Have you been seen in the emergency room and/or had an admission in a hospital since we last saw you?  no   Have you had your routine dental cleaning in the past 6 months?  no    Do you have an active MyChart account? If no, what is the barrier?  No: patient does not have a computer    Patient Care Team:  Sindy Messing, PA-C as PCP - General (Physician Assistant)    Medical History Review  Past Medical, Family, and Social History reviewed and does contribute to the patient presenting condition    Health Maintenance   Topic Date Due   ??? Pneumococcal med risk (1 of 1 - PPSV23) 11/22/1976   ??? Hepatitis C screen  02/01/2015 (Originally 02/19/1955)   ??? Flu vaccine (1) 02/07/2015 (Originally 09/10/2014)   ??? HIV screen  03/08/2015 (Originally 11/22/1972)   ??? Diabetes screen  01/30/2018   ??? Lipid screen  09/21/2019   ??? Colon cancer screen colonoscopy  10/26/2019   ??? DTaP/Tdap/Td vaccine (2 - Td) 09/30/2024                 HPI:     Hypertension   This is a chronic problem. The current episode started more than 1 year ago. Pertinent negatives include no anxiety, blurred vision,  chest pain, headaches, malaise/fatigue, neck pain, orthopnea, palpitations, peripheral edema, PND, shortness of breath or sweats. Risk factors for coronary artery disease include male gender and obesity. There is no history of chronic renal disease.   Hyperlipidemia   This is a chronic problem. Exacerbating diseases include diabetes and obesity. He has no history of chronic renal disease, hypothyroidism, liver disease or nephrotic syndrome. Pertinent negatives include no chest pain, focal sensory loss, focal weakness, leg pain, myalgias or shortness of breath.   Diabetes   He presents for his follow-up diabetic visit. He has type 2 diabetes mellitus. Pertinent negatives for hypoglycemia include no dizziness, headaches or sweats. Pertinent negatives for diabetes include no blurred vision, no chest pain, no fatigue and no weakness.       HEMOGLOBIN A1C (%)   Date Value   01/31/2015 5.8   10/01/2014 5.8             ( goal A1C is < 7)   No results found for: LABMICR  LDL CHOLESTEROL (mg/dL)   Date Value   09/21/2014 125       (goal LDL is <100)  AST (U/L)   Date Value   09/21/2014 22     ALT (U/L)   Date Value   09/21/2014 28     BUN (mg/dL)   Date Value   01/31/2015 10     BP Readings from Last 3 Encounters:   02/01/15 140/84   12/28/14 (!) 150/92   11/23/14 (!) 153/101          (goal 120/80)    Past Medical History   Diagnosis Date   ??? Hypertension       Past Surgical History   Procedure Laterality Date   ??? Appendectomy     ??? Other surgical history Right 10/26/2014     right ear fx, sx   ??? Colonoscopy  10/26/2014       Family History   Problem Relation Age of Onset   ??? Asthma Mother    ??? Other Mother    ??? Diabetes Mother    ??? High Blood Pressure Mother    ??? Heart Disease Paternal Grandfather        Social History   Substance Use Topics   ??? Smoking status: Current Every Day Smoker     Packs/day: 1.00     Years: 30.00   ??? Smokeless tobacco: Not on file   ??? Alcohol use Yes      Comment: bottle of liquor a week       Current Outpatient Prescriptions   Medication Sig Dispense Refill   ??? albuterol sulfate HFA (PROAIR HFA) 108 (90 BASE) MCG/ACT inhaler Inhale 2 puffs into the lungs every 6 hours as needed for Wheezing 1 Inhaler 3   ??? lisinopril-hydrochlorothiazide (PRINZIDE;ZESTORETIC) 20-25 MG per tablet Take 1 tablet by mouth daily 30 tablet 3   ??? labetalol (NORMODYNE) 200 MG tablet Take 1 tablet by mouth 2 times daily 60 tablet 1   ??? lisinopril (PRINIVIL;ZESTRIL) 20 MG tablet Take 1 tablet by mouth daily (Patient taking differently: Take 20 mg by mouth 2 times daily ) 30 tablet 3   ??? metFORMIN (GLUCOPHAGE-XR) 500 MG extended release tablet Take 1 tablet by mouth daily (with breakfast) 30 tablet 3   ??? glucose blood VI test strips (ASCENSIA AUTODISC VI;ONE TOUCH ULTRA TEST VI) strip Use with associated glucose meter.    Dx: Hyperglycemia/insulin resistance 100 strip 11   ??? Lancet Device MISC 100 each by Does not apply route continuous May use any brand    Dx: Hyperglycemia/insulin resistance 100 each 11   ??? glucose monitoring kit (FREESTYLE) monitoring kit 1 kit by Does not apply route daily Insulin resistance 1 kit 0   ??? Blood Pressure KIT 1 Device by Does not apply route daily 1 kit 0   ??? losartan (COZAAR) 100 MG tablet Take 200 mg by mouth 2 times daily       No current facility-administered medications for this visit.      No Known Allergies    Health Maintenance   Topic Date Due   ??? Pneumococcal med risk (1 of 1 - PPSV23) 11/22/1976   ??? Hepatitis C screen  02/01/2015 (Originally 1955-04-15)   ??? Flu vaccine (1) 02/07/2015 (Originally 09/10/2014)   ??? HIV screen  03/08/2015 (Originally 11/22/1972)   ??? Diabetes screen  01/30/2018   ??? Lipid screen  09/21/2019   ??? Colon cancer screen colonoscopy  10/26/2019   ??? DTaP/Tdap/Td vaccine (2 - Td) 09/30/2024       Subjective:      Review of Systems  Constitutional: Negative for chills, diaphoresis, fatigue, fever and malaise/fatigue.   HENT: Negative for congestion, ear discharge, ear  pain, postnasal drip, rhinorrhea, sinus pressure and sore throat.    Eyes: Negative for blurred vision, discharge and itching.   Respiratory: Negative for cough, chest tightness and shortness of breath.    Cardiovascular: Negative for chest pain, palpitations, orthopnea, leg swelling and PND.   Gastrointestinal: Negative for abdominal pain, diarrhea, nausea and vomiting.   Genitourinary: Negative for dysuria and frequency.   Musculoskeletal: Negative for myalgias, neck pain and neck stiffness.   Skin: Negative for rash.   Neurological: Negative for dizziness, focal weakness, weakness, light-headedness, numbness and headaches.   All other systems reviewed and are negative.      Objective:     Physical Exam   Constitutional: He is oriented to person, place, and time. He appears well-developed and well-nourished. No distress.   BP 140/84  Pulse 81  Temp 97.9 ??F (36.6 ??C) (Oral)   Ht 6' (1.829 m)  Wt 261 lb (118.4 kg)  SpO2 95%  BMI 35.4 kg/m2     HENT:   Head: Normocephalic and atraumatic.   Right Ear: External ear normal.   Left Ear: External ear normal.   Nose: Nose normal.   Mouth/Throat: Oropharynx is clear and moist.   Eyes: Conjunctivae and EOM are normal. Pupils are equal, round, and reactive to light. Right eye exhibits no discharge. Left eye exhibits no discharge. No scleral icterus.   Neck: Normal range of motion. Neck supple. No tracheal deviation present. No thyromegaly present.   Cardiovascular: Normal rate, regular rhythm and normal heart sounds.  Exam reveals no gallop and no friction rub.    No murmur heard.  Pulmonary/Chest: Effort normal. No stridor. No respiratory distress. He has wheezes. He has no rales. He exhibits no tenderness.   Abdominal: Soft. Bowel sounds are normal. He exhibits no distension. There is no tenderness. There is no rebound and no guarding.   Musculoskeletal: He exhibits no edema.   Neurological: He is alert and oriented to person, place, and time. Gait normal.   Skin: Skin is  warm and dry. No rash noted. He is not diaphoretic.   Psychiatric: He has a normal mood and affect. His affect is not inappropriate.   Nursing note and vitals reviewed.    Visit Vitals   ??? BP 140/84   ??? Pulse 81   ??? Temp 97.9 ??F (36.6 ??C) (Oral)   ??? Ht 6' (1.829 m)   ??? Wt 261 lb (118.4 kg)   ??? SpO2 95%   ??? BMI 35.4 kg/m2       Assessment:      1. Uncontrolled hypertension  93975 - PR Duplex Abd/Pel Vasc Study, Complete   2. BMI 35.0-35.9,adult     3. Family history of diabetes mellitus     4. Tobacco abuse     5. Insulin resistance     6. Hyperlipidemia, unspecified hyperlipidemia type               Plan:      No Follow-up on file.    Orders Placed This Encounter   Procedures   ??? [id number] - PR Duplex Abd/Pel Vasc Study, Complete     Orders Placed This Encounter   Medications   ??? albuterol sulfate HFA (PROAIR HFA) 108 (90 BASE) MCG/ACT inhaler     Sig: Inhale 2 puffs into the lungs every 6 hours as needed for Wheezing  Dispense:  1 Inhaler     Refill:  3   ??? lisinopril-hydrochlorothiazide (PRINZIDE;ZESTORETIC) 20-25 MG per tablet     Sig: Take 1 tablet by mouth daily     Dispense:  30 tablet     Refill:  3      Prefers to be called Ray. Here from [place]. Caring for mother with dementia.  ????  Labs from 01/31/15 reviewed.  ????  HTN - Taking Cozaar and tolerating well. Cont med. BP still have. Started norvasc. Tolerated well. BP still high. Increase losartan and add HTCZ - Still high. Med changed to labetolol. BP somewhat better. Add lisinopril.  ????  Hyperglycemia/insulin resistance - BS 162, insulin 54, A1C 5.8. Diet and exercise encouraged. Check BS. Start metformin. SE discussed. Add statin next visit. DM supplies ordered.  ????  Hyperlipidemia - Mildly elevated. Diet and exercise encouraged.  ????  Weight gain - States not as active. 22 lbs over the last year. Diet and exercise encouraged. Labs   ????  Tobacco abuse - 1ppd x 27 years. Understands risks. Cxr 09/21/14 shows nonspecific linear opacity at left lung base likely  atelectasis scarring with other etiologies not excluded. CT chest ordered.  Start Dulera  ????  Colonoscopy - 10/26/14. 2 polyps removed. Repeat 5 yrs.  ????  Balanced diet and routine exercise encouraged.  ????  Multivitamin with vitamin D daily encouraged.  ????  Good sleep hygiene encouraged.  ????  Dental exam q 6 mo.  ????  Vision yearly.  ????  Sunscreen encouraged.  ????  Immunizations reviewed.  ????  Prevnar 13 09/2014  ????  Tetanus - 09/2014  ????  Flu - Pharmacy 2016  ????  Zostavax - rx given.   Patient given educational materials - see patient instructions.  Discussed use, benefit, and side effects of prescribed medications.  All patient questions answered. Pt voiced understanding. Reviewed health maintenance.  Instructed to continue current medications, diet and exercise.  Patient agreed with treatment plan. Follow up as directed.     Electronically signed by Sindy Messing, PA-C on 02/01/2015 at 9:50 AM

## 2015-02-01 NOTE — Addendum Note (Signed)
Addended by: Nani GasserBURGOON, Lance Galas on: 02/01/2015 09:53 AM     Modules accepted: Orders

## 2015-02-22 NOTE — Telephone Encounter (Signed)
He said he will not be sedated. He said it is for the novacaine

## 2015-02-22 NOTE — Telephone Encounter (Signed)
Letter generated and faxed

## 2015-02-22 NOTE — Telephone Encounter (Signed)
lvm asking pt to call back

## 2015-02-22 NOTE — Telephone Encounter (Signed)
Pt called with his blood pressure readings     Last week: 191/75  This last week has been around: 149/87, 135/82, 141/78  Today: 139/78

## 2015-02-22 NOTE — Telephone Encounter (Signed)
That's fine.  Please generate

## 2015-02-22 NOTE — Telephone Encounter (Signed)
Will it be with general or local sedation???

## 2015-02-22 NOTE — Telephone Encounter (Signed)
Pt informed. He would like a letter faxed to the dentist stating it is okay to get his dental work done. Dr. Lucianne MussHeeman is his dentist. Fax number [phone]

## 2015-02-22 NOTE — Telephone Encounter (Signed)
Better!  Cont meds

## 2015-03-12 ENCOUNTER — Encounter

## 2015-04-19 MED ORDER — LABETALOL HCL 200 MG PO TABS
200 MG | ORAL_TABLET | ORAL | 1 refills | Status: AC
Start: 2015-04-19 — End: ?

## 2015-05-03 ENCOUNTER — Encounter: Payer: PRIVATE HEALTH INSURANCE | Attending: Physician Assistant | Primary: Physician Assistant

## 2017-06-08 IMAGING — CT CTA CHEST
2 of 3 series · 16 of 46 positions shown, 18 images · IV contrast (APPLIED)
Comparison: There are no previous exams available for comparison.

CTA CHEST, 06/08/2017 [DATE]: 
CLINICAL INDICATION: Thoracic aneurysm. No chest pain. 
A search for DICOM formatted images was conducted for prior CT imaging studies 
completed at a non-affiliated media free facility.
TECHNIQUE: The chest was scanned from base of neck through the lung bases with 
100 cc's of Isovue 370 injected intravenously on a high resolution low dose CT 
scanner using dose reduction techniques.  Routine MPR and MIP 3D renderings 
were 
reconstructed on an independent workstation with concurrent physician 
supervision.

[Series 4: cta chest 2.0 i31s 3 · axial · 0.98mm/px · z∈[-437,-115]mm · 13 of 185 slices shown, 15 images]
[im 12/185  soft-tissue]
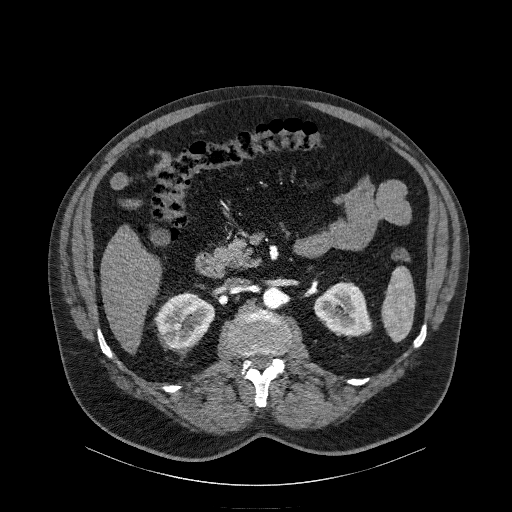
[im 12/185  bone]
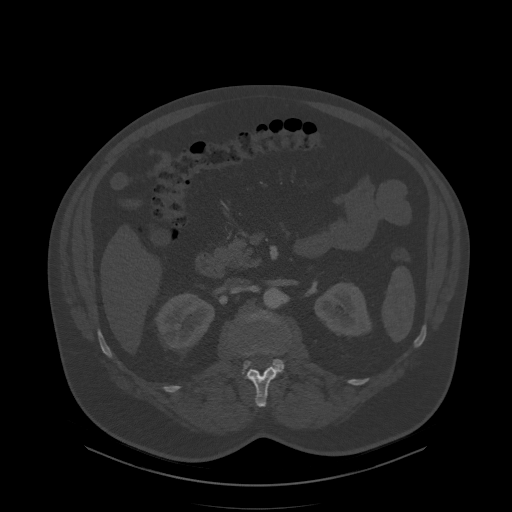
[im 24/185  soft-tissue]
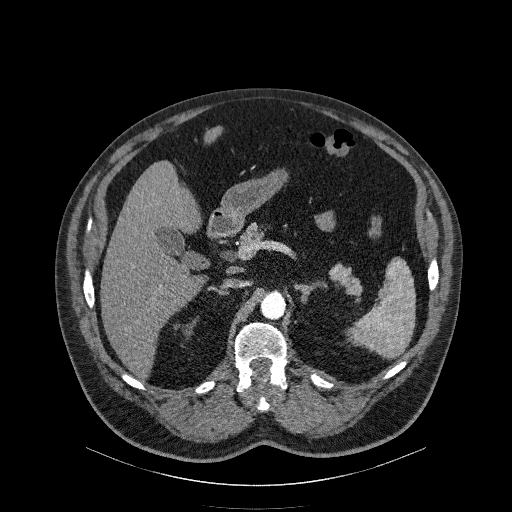
[im 36/185  soft-tissue]
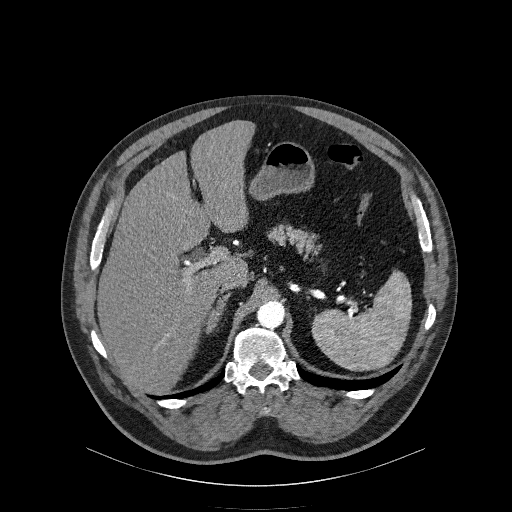
[im 54/185  soft-tissue]
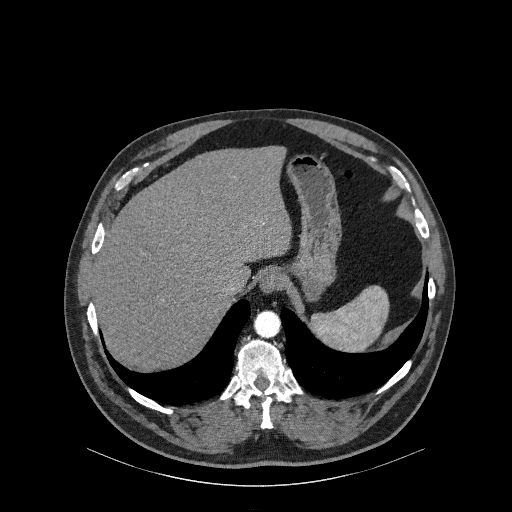
[im 66/185  soft-tissue]
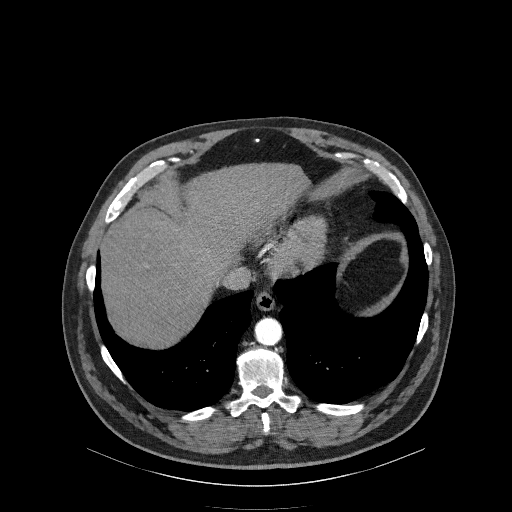
[im 78/185  soft-tissue]
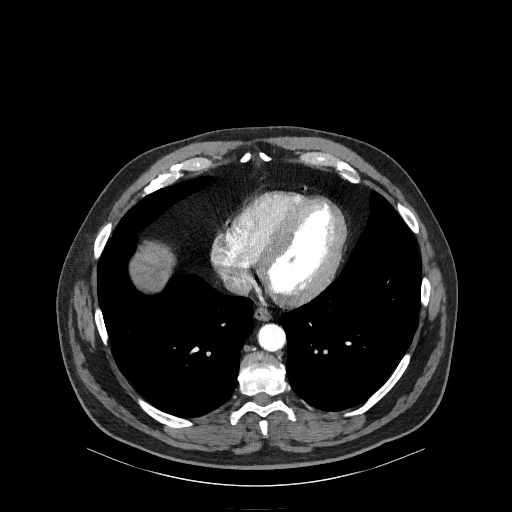
[im 95/185  soft-tissue]
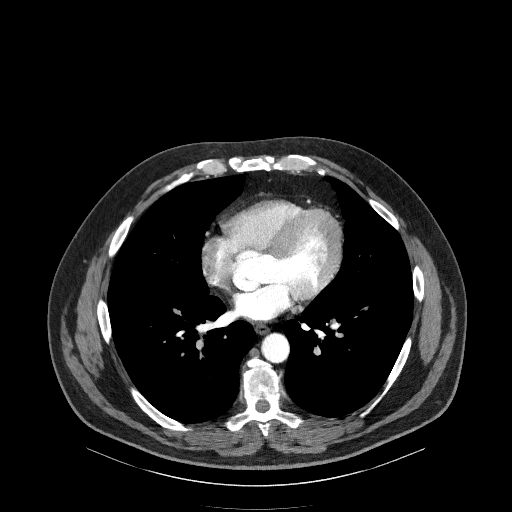
[im 107/185  soft-tissue]
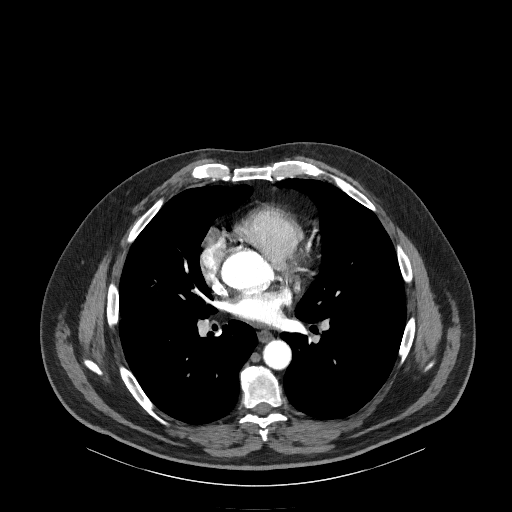
[im 119/185  soft-tissue]
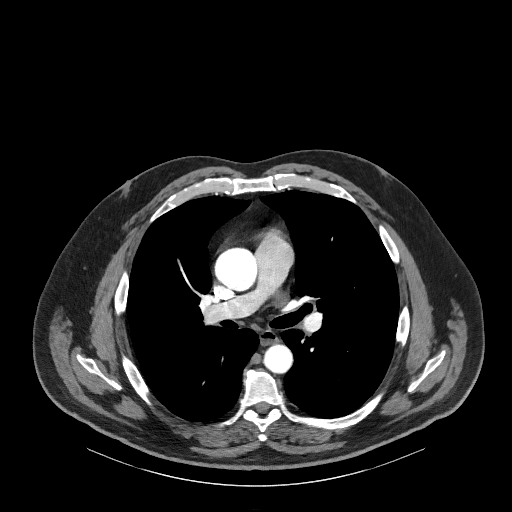
[im 119/185  bone]
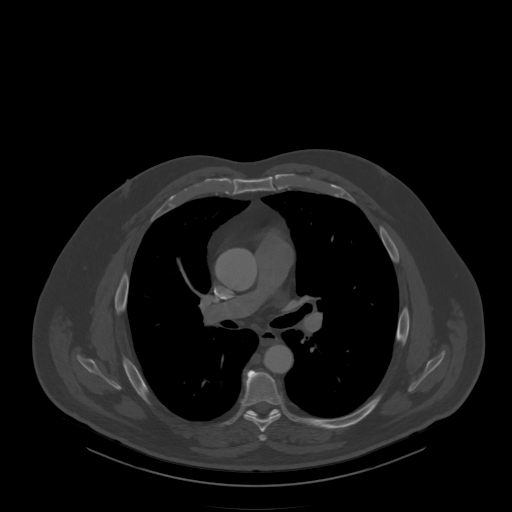
[im 131/185  soft-tissue]
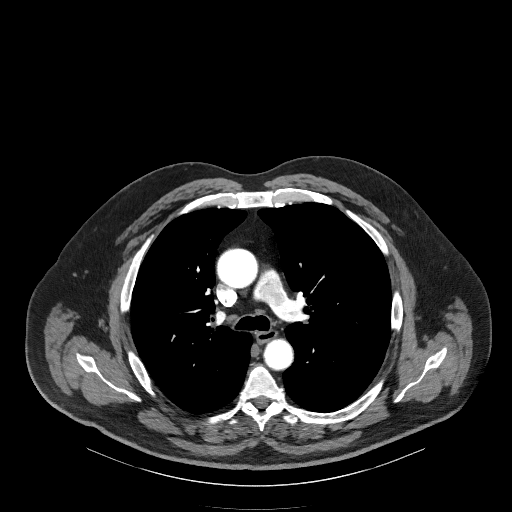
[im 149/185  soft-tissue]
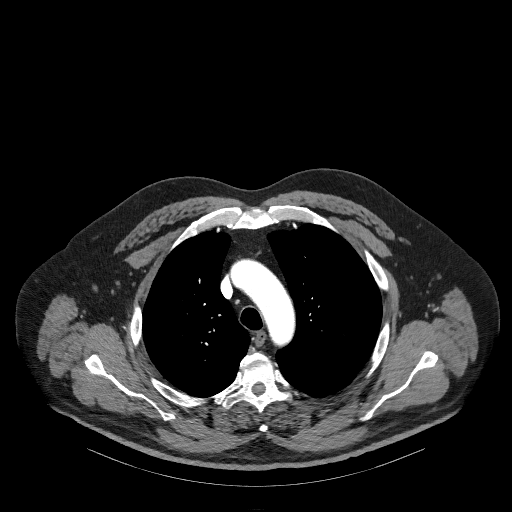
[im 161/185  soft-tissue]
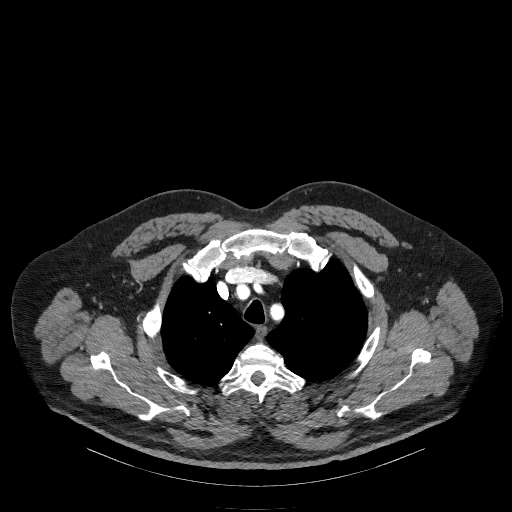
[im 173/185  soft-tissue]
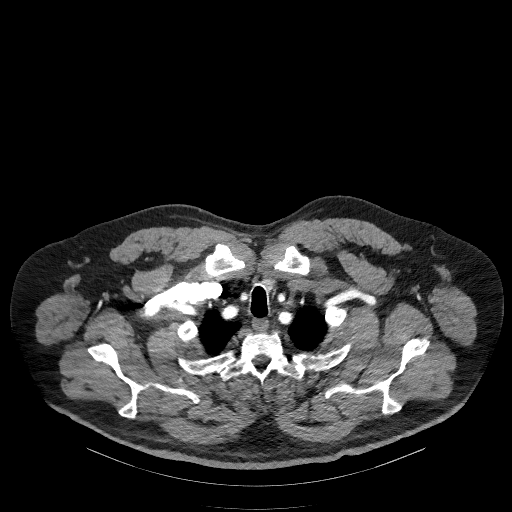

[Series 6: coronal · coronal · 0.72mm/px · 3 of 185 slices shown]
[im 62/185  soft-tissue]
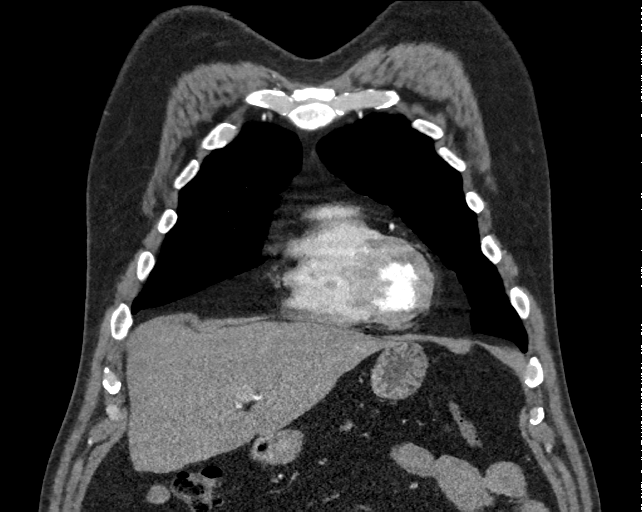
[im 82/185  soft-tissue]
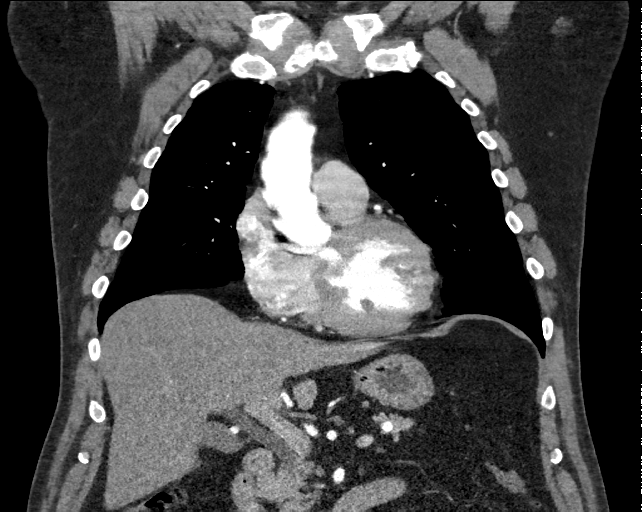
[im 103/185  soft-tissue]
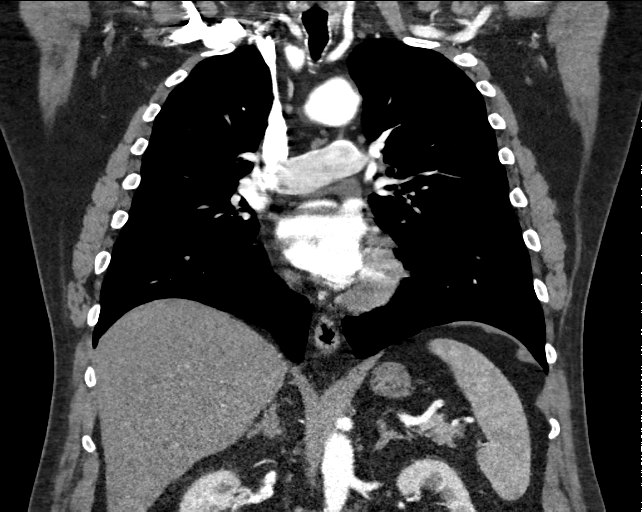

[16 of 46 positions shown; findings below may reference images not displayed]

FINDINGS: Ascending thoracic aorta measures 4.0 cm in transverse diameter on 
coronal image 88 ascending thoracic aorta measures 2.7 cm now no dissection. 
Heart size normal. Mild coronary calcifications. Pulmonary arteries are 
moderately opacified. No central pulmonary emboli noted. Lungs are well- 
expanded 
and clear. Small hiatal hernia. 
Fatty liver. Gallbladder, spleen, and pancreas are normal. Small left adrenal 
adenoma. Right adrenal is normal. Visualized upper kidneys are normal in size 
and function.
IMPRESSION: Mildly ectatic ascending thoracic aortic, 4.0 cm. Surgery is not a 
consideration 
until the aorta is 5 cm in diameter. No priors. 
RADIATION DOSE REDUCTION: All CT scans are performed using radiation dose 
reduction techniques, when applicable.  Technical factors are evaluated and 
adjusted to ensure appropriate moderation of exposure.  Automated dose 
management technology is applied to adjust the radiation doses to minimize 
exposure while achieving diagnostic quality images.

## 2018-09-20 IMAGING — CT CTA CHEST
2 of 3 series · 16 of 46 positions shown, 18 images · IV contrast (APPLIED)
Comparison: There are no prior exam(s) available for comparison within the 
past 12 months;

CTA CHEST, 09/20/2018 [DATE]: 
CLINICAL INDICATION: Thoracic aortic aneurysm 
A search for DICOM formatted images was conducted for prior CT imaging studies 
completed at a non-affiliated media free facility.
TECHNIQUE: The chest was scanned from base of neck through the lung bases with 
100 cc's of Isovue 370 injected intravenously on a high resolution low dose CT 
scanner using dose reduction techniques.  Routine MPR and MIP 3D renderings were 
reconstructed on an independent workstation with concurrent physician 
supervision.

[Series 4: cta chest 2.0 i31s 3 · axial · 0.98mm/px · z∈[-409,-89]mm · 13 of 184 slices shown, 15 images]
[im 12/184  soft-tissue]
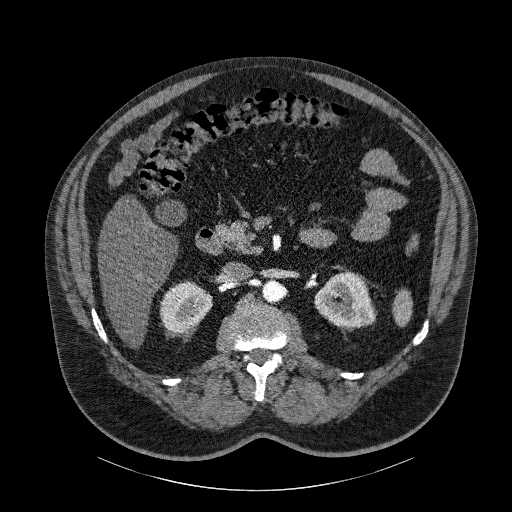
[im 12/184  bone]
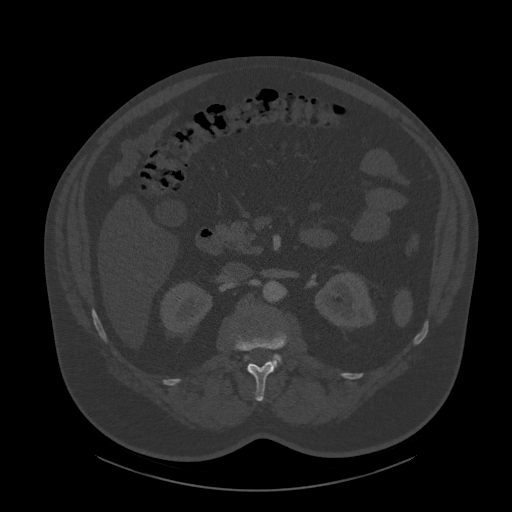
[im 24/184  soft-tissue]
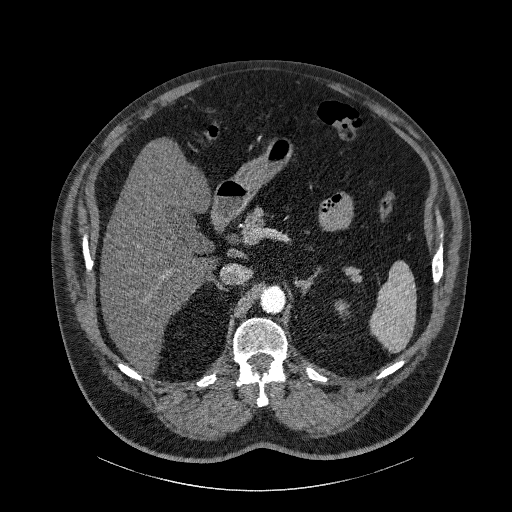
[im 36/184  soft-tissue]
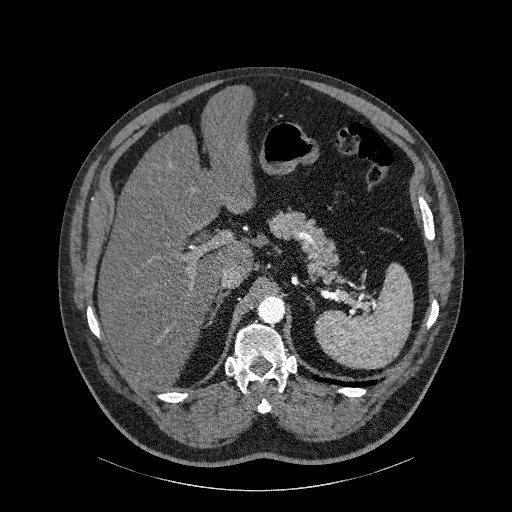
[im 54/184  soft-tissue]
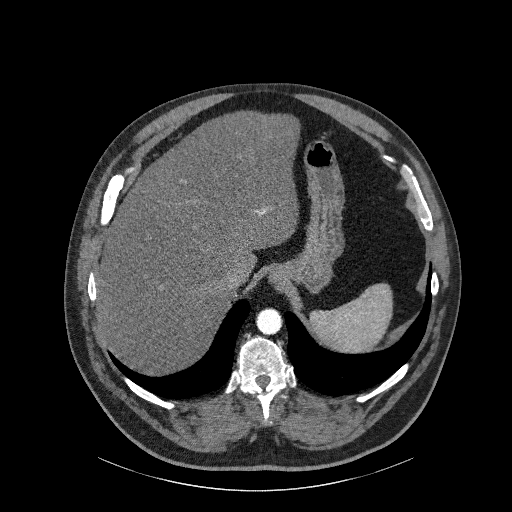
[im 65/184  soft-tissue]
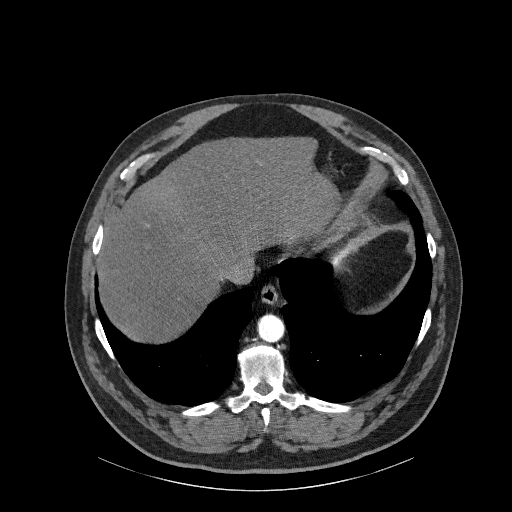
[im 77/184  soft-tissue]
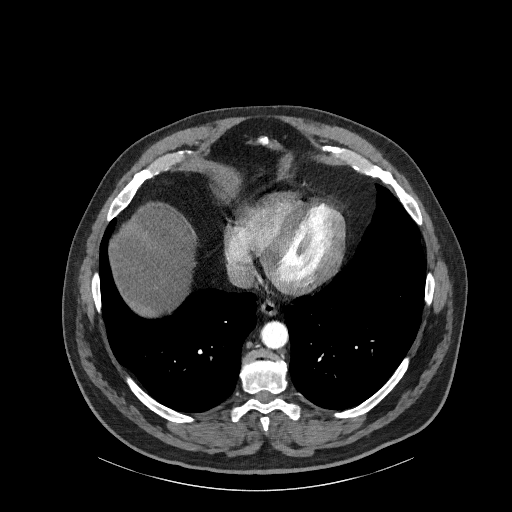
[im 95/184  soft-tissue]
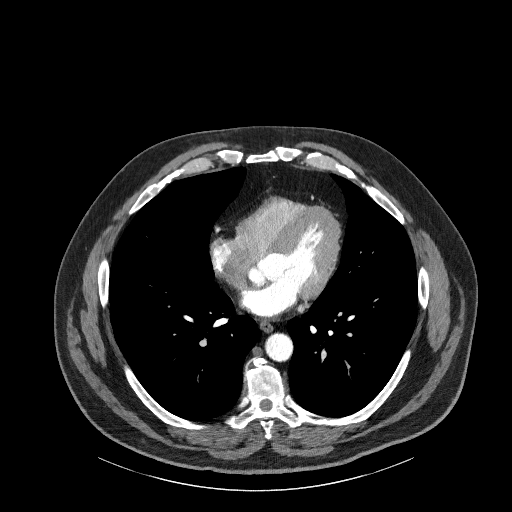
[im 107/184  soft-tissue]
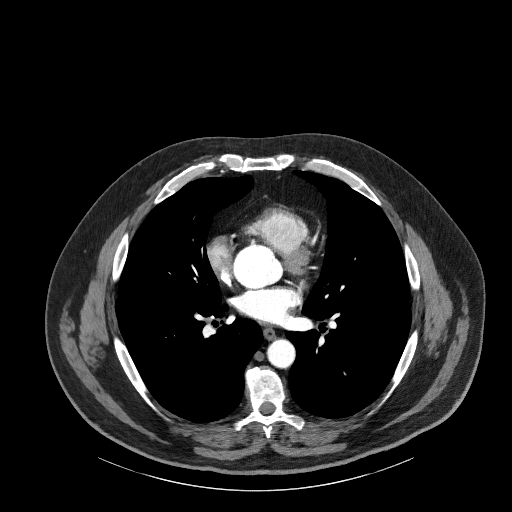
[im 119/184  soft-tissue]
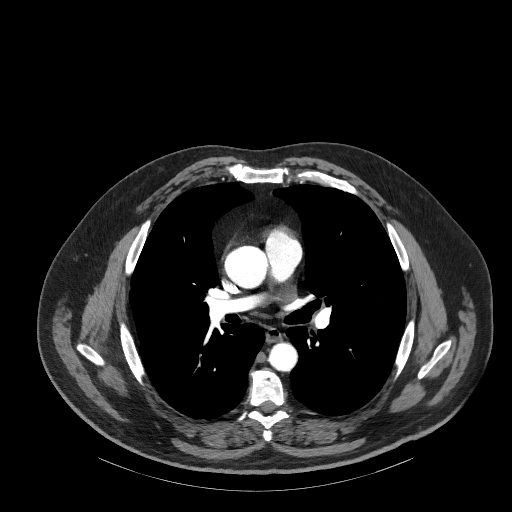
[im 119/184  bone]
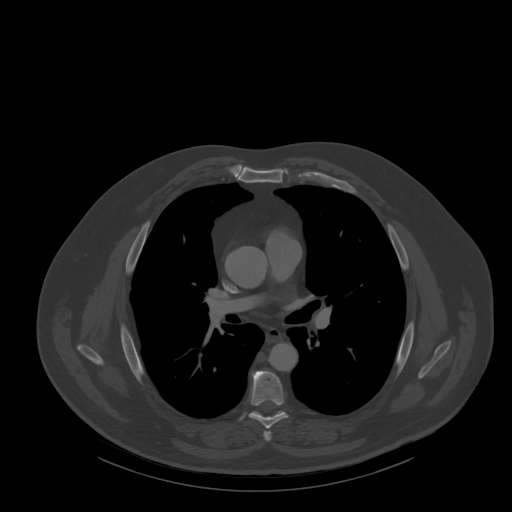
[im 130/184  soft-tissue]
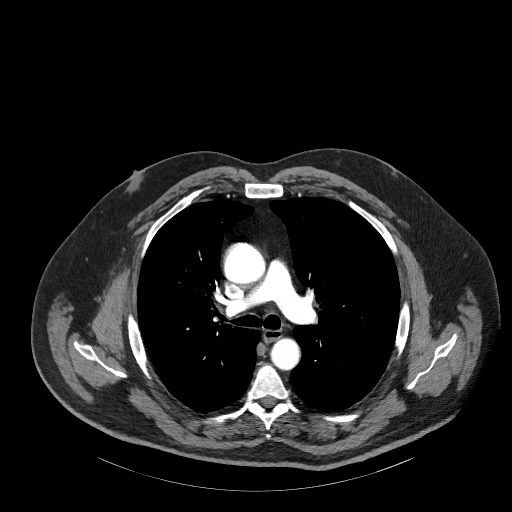
[im 148/184  soft-tissue]
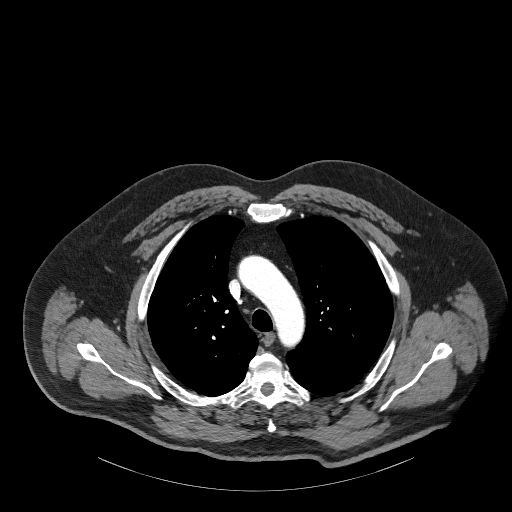
[im 160/184  soft-tissue]
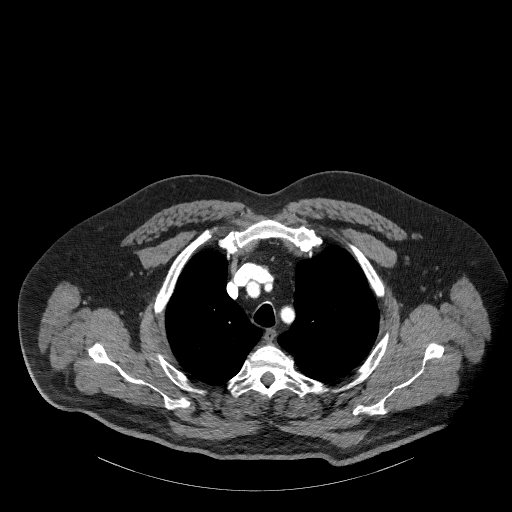
[im 172/184  soft-tissue]
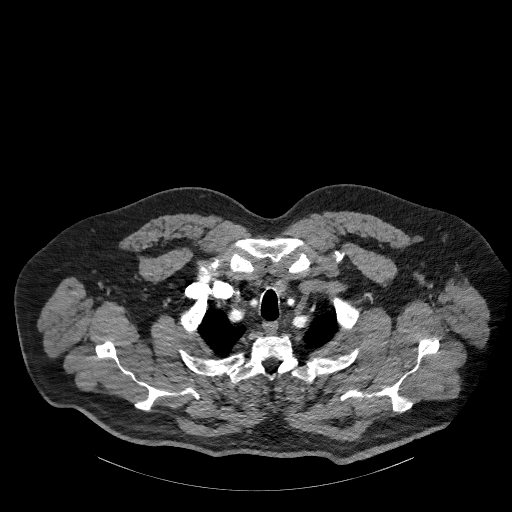

[Series 6: coronal · coronal · 0.72mm/px · 3 of 186 slices shown]
[im 62/186  soft-tissue]
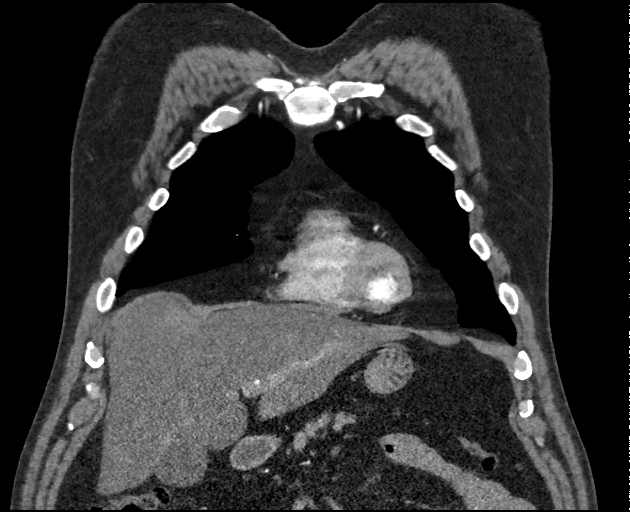
[im 83/186  soft-tissue]
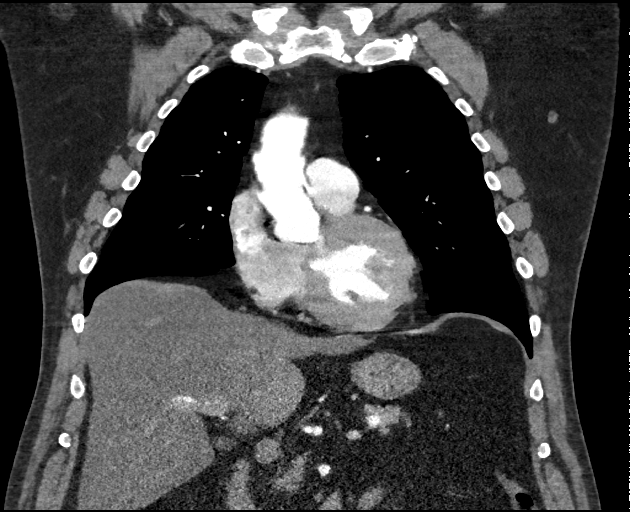
[im 103/186  soft-tissue]
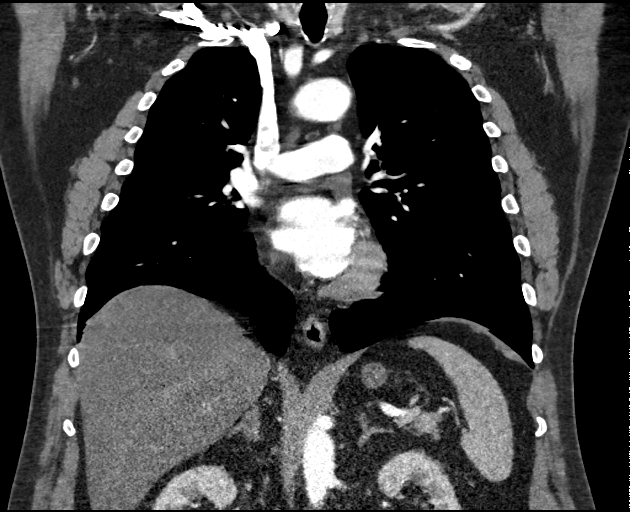

[16 of 46 positions shown; findings below may reference images not displayed]

however, comparison was made to the prior exam(s) dated  CTA 
chest June 08, 2017
FINDINGS: The reconstructed images are limited by motion artifact. Maximum 
diameter of the ascending thoracic aorta is 4.0-4.1 cm, not different from the 
prior study. There is no evidence for dissection. The great vessels are open. 
There is no cardiomegaly. No pulmonary embolism. There are mild coronary artery 
calcifications. There is no noncalcified pulmonary nodule. No infiltrate or 
effusion. There is no mediastinal or axillary adenopathy. There are degenerative 
changes. There appears to be fatty infiltration of the liver. No adrenal nodule.
IMPRESSION: Stable metastatic ascending thoracic aorta, 4.0 cm diameter. No evidence for 
dissection. There is minimal wall plaque. 
Mild coronary artery calcifications. 
No pulmonary embolism. 
No active cardiopulmonary findings. 
RADIATION DOSE REDUCTION: All CT scans are performed using radiation dose 
reduction techniques, when applicable.  Technical factors are evaluated and 
adjusted to ensure appropriate moderation of exposure.  Automated dose 
management technology is applied to adjust the radiation doses to minimize 
exposure while achieving diagnostic quality images.

## 2019-03-29 IMAGING — CT CTA CHEST ABDOMEN AND PELVIS
2 of 4 series · 16 of 46 positions shown, 18 images · IV contrast (APPLIED)
Comparison: Comparison was made to the prior exam(s) within the last 12 months 
dated  CT CT chest 09/20/2018.

CTA CHEST ABDOMEN AND PELVIS, 03/29/2019 [DATE]: 
CLINICAL INDICATION: Thoracic aortic aneurysm. 
A search for DICOM formatted images was conducted for prior CT imaging studies 
completed at a non-affiliated media free facility.
TECHNIQUE: The chest,abdomen and pelvis were scanned from base of neck through 
the pubic rami with 100 cc's of Isovue 370 injected intravenously on a high 
resolution low dose CT scanner using dose reduction techniques.  Routine MPR and 
MIP 3D renderings were reconstructed on an independent workstation with 
concurrent physician supervision.

[Series 4: cta cap 1.5 i31s 3 · axial · 0.98mm/px · z∈[-729,-108]mm · 13 of 458 slices shown, 15 images]
[im 22/458  soft-tissue]
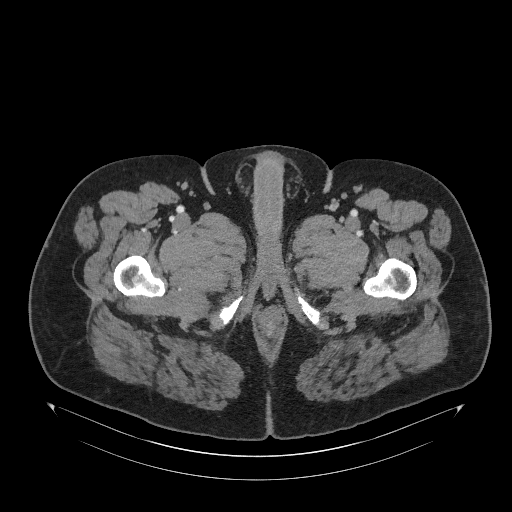
[im 22/458  bone]
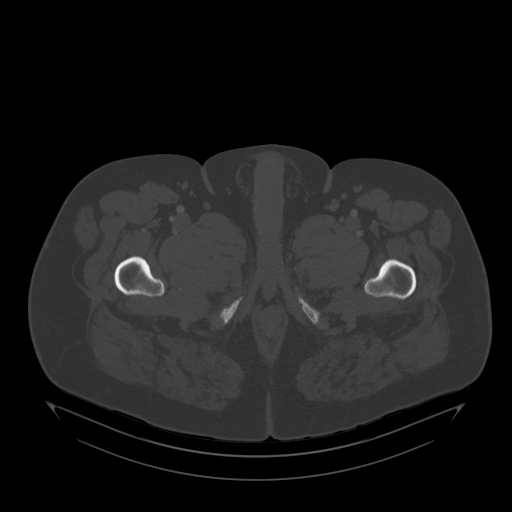
[im 66/458  soft-tissue]
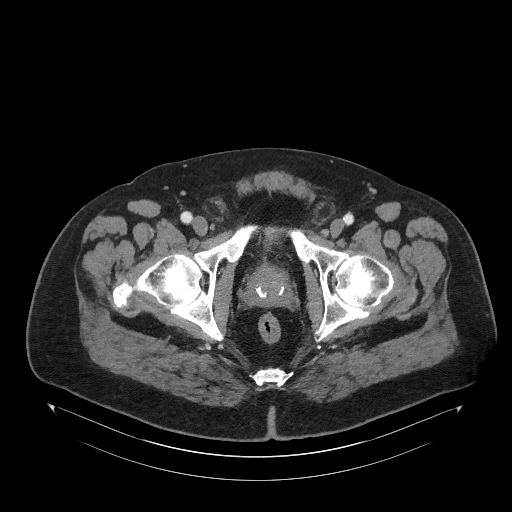
[im 88/458  soft-tissue]
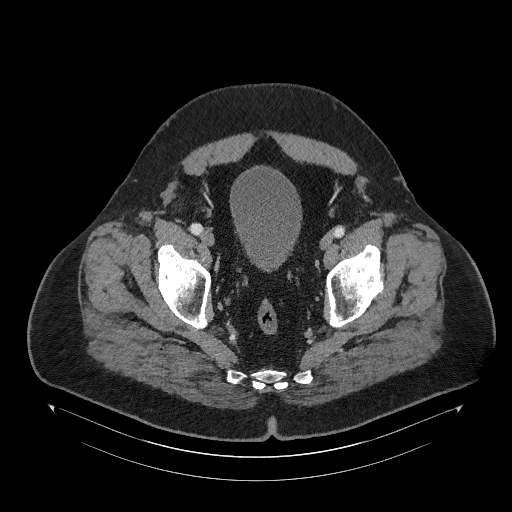
[im 131/458  soft-tissue]
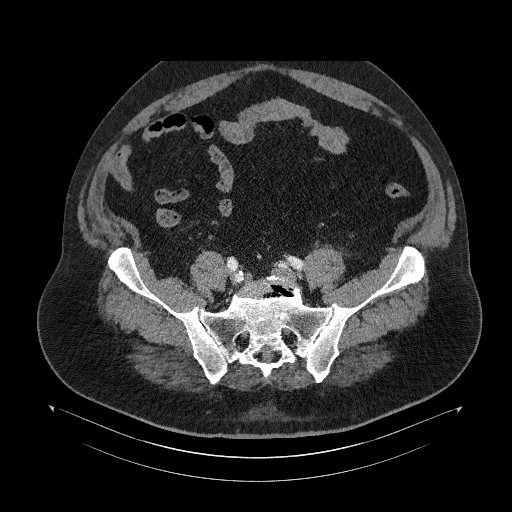
[im 153/458  soft-tissue]
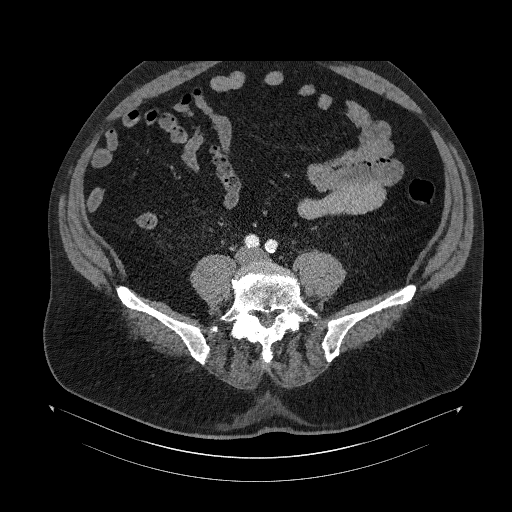
[im 196/458  soft-tissue]
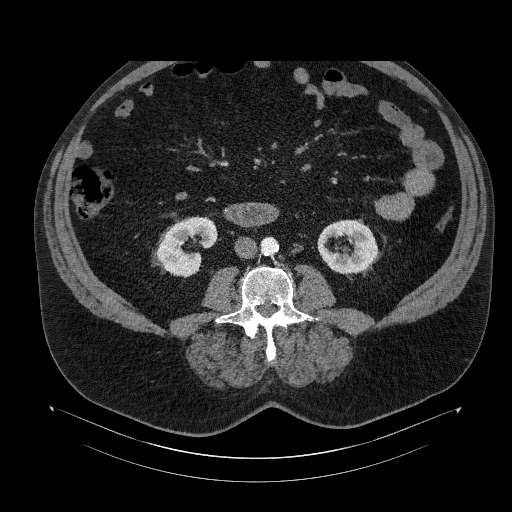
[im 240/458  soft-tissue]
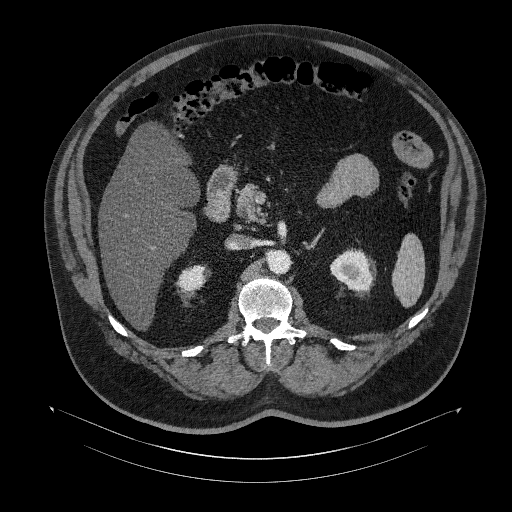
[im 262/458  soft-tissue]
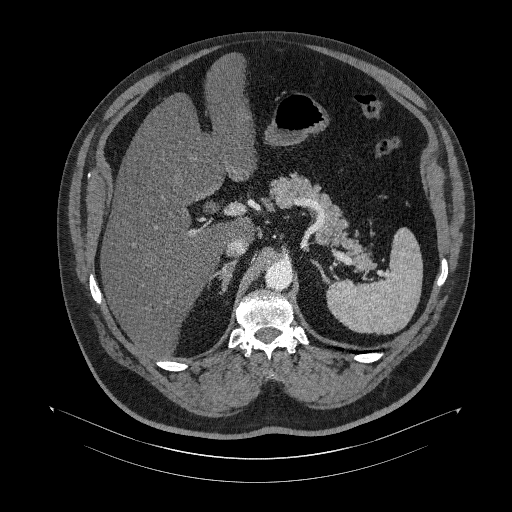
[im 305/458  soft-tissue]
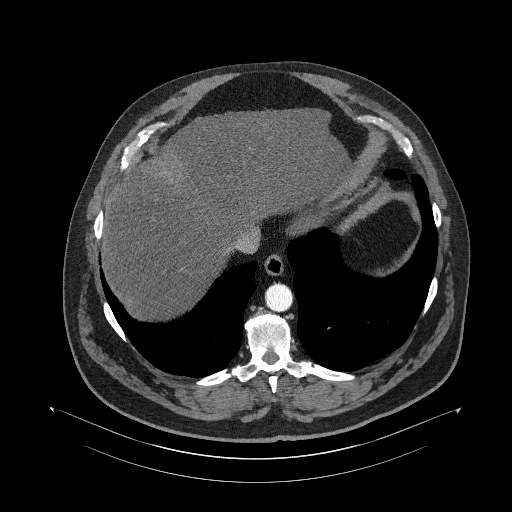
[im 305/458  bone]
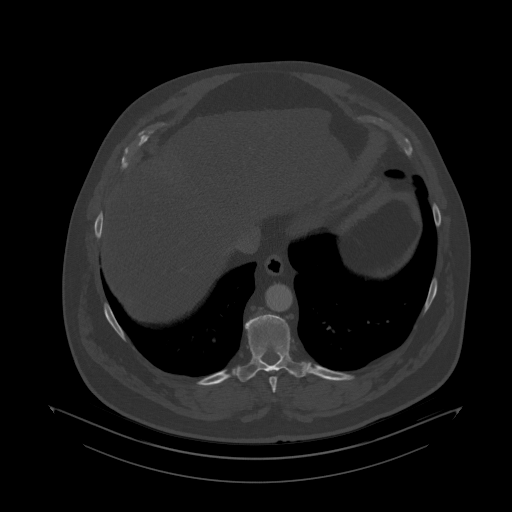
[im 327/458  soft-tissue]
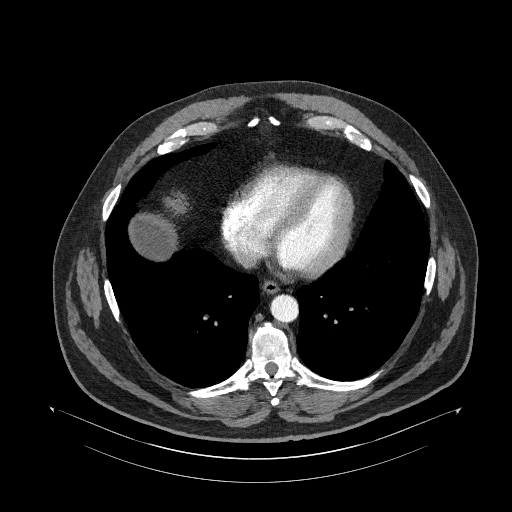
[im 370/458  soft-tissue]
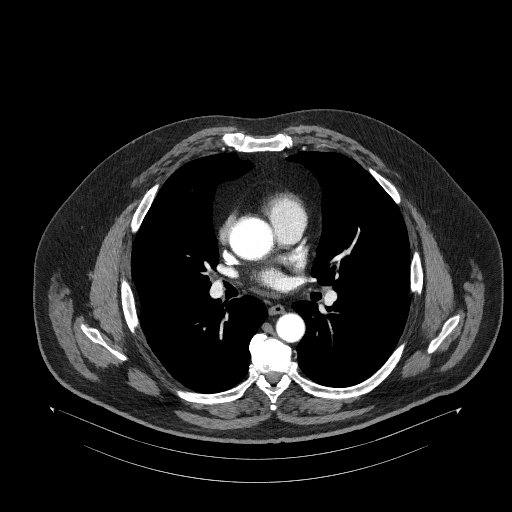
[im 392/458  soft-tissue]
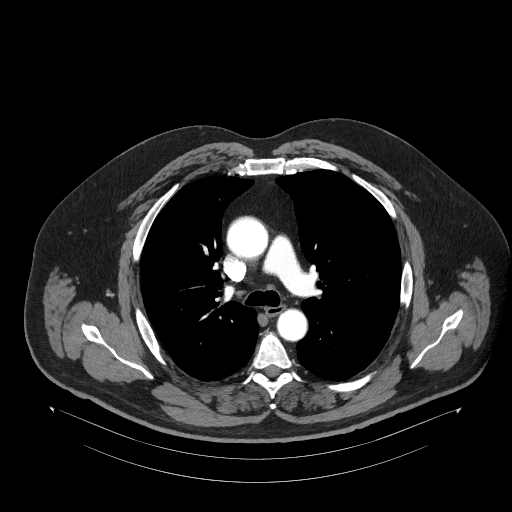
[im 436/458  soft-tissue]
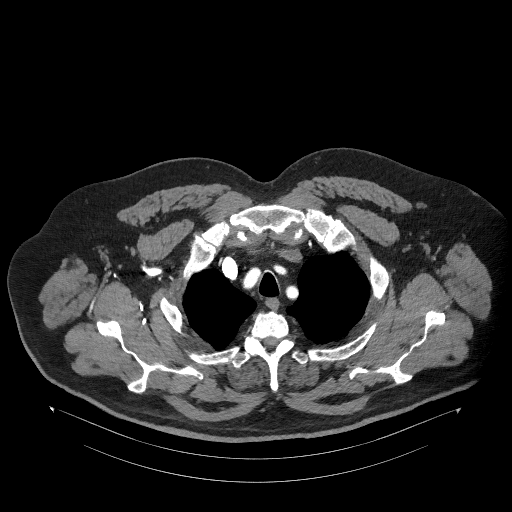

[Series 5: coronal · coronal · 1.40mm/px · 3 of 273 slices shown]
[im 91/273  soft-tissue]
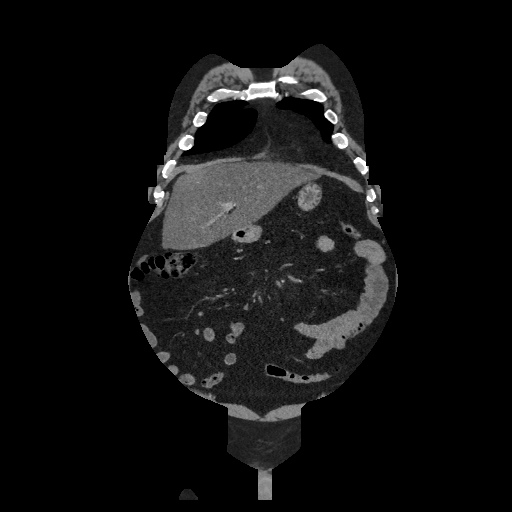
[im 121/273  soft-tissue]
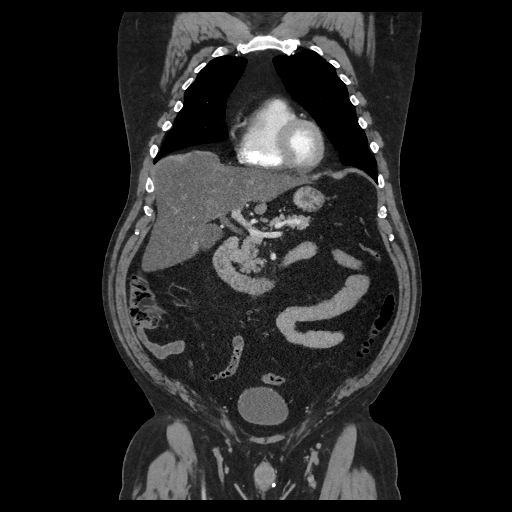
[im 152/273  soft-tissue]
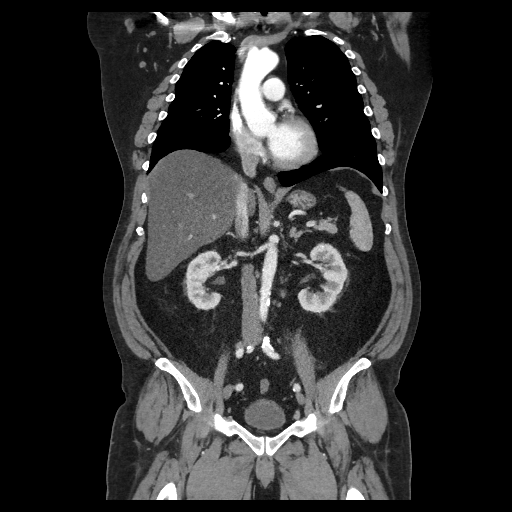

[16 of 46 positions shown; findings below may reference images not displayed]

FINDINGS: PULMONARY ARTERIES: No pulmonary embolus. 
LUNGS AND PLEURA: Lungs clear.   No effusions. 
MEDIASTINUM: No masses. 
LYMPH NODES: No adenopathy. 
HEART: Normal in size.  No pericardial effusion. Mild coronary calcification. No 
central pulmonary embolus. 
AORTA AND GREAT VESSELS: Ascending aorta measures 4.1 cm in its greatest 
dimension. No dissection. Aortic arch measures 2.7 cm. Descending thoracic aorta 
measures 2.6 cm. Great vessels are of normal caliber with no significant focal 
stenosis or evidence of aneurysm.. 
ABDOMINAL AORTA: Measures 1.4 cm in size. No dissection or aneurysm. 
MESENTERIC ARTERIES: No significant mesenteric stenosis or aneurysm. 
RENAL ARTERIES: No significant renal artery stenosis or aneurysm. 
ILIOFEMORAL VESSELS: Common iliac arteries measure 9 mm on right and 9 mm on the 
left. No significant iliofemoral vessel stenosis or aneurysm. 
LIVER: Fatty infiltration. No mass.  No intrahepatic biliary dilatation. 
GALLBLADDER: No wall thickening.  No stones. 
COMMON BILE DUCT: Normal caliber.  No stones. 
SPLEEN: Within normal limits. 
PANCREAS: No mass.  No pancreatic fluid collections. 
ADRENALS: Stable small low-attenuation nodule left adrenal gland measuring 
approximately 10 mm.. 
KIDNEYS: No masses.  No hydronephrosis. 
LYMPH NODES: No adenopathy. 
STOMACH, SMALL BOWEL AND COLON: No bowel wall thickening or obstruction. 
Scattered colonic diverticula but no pericolonic inflammatory process or 
collection. 
PERITONEAL CAVITY: No mesenteric stranding or free fluid. 
OSSEOUS STRUCTURES: Degenerative change. No acute fracture or destructive 
lesion. 
PELVIC ORGANS: Bladder is decompressed.
IMPRESSION: No acute consolidation or effusion. 
Stable mild fusiform dilatation ascending aorta measuring approximately 4.1 cm. 
No evidence of abdominal aortic aneurysm. 
No thoracic, abdominal or pelvic adenopathy. 
Fatty liver. 
Stable small left adrenal nodule. 
RADIATION DOSE REDUCTION: All CT scans are performed using radiation dose 
reduction techniques, when applicable.  Technical factors are evaluated and 
adjusted to ensure appropriate moderation of exposure.  Automated dose 
management technology is applied to adjust the radiation doses to minimize 
exposure while achieving diagnostic quality images.

## 2020-04-17 IMAGING — CT CTA NECK
2 of 3 series · 11 of 37 positions shown, 12 images · IV contrast (APPLIED)
Comparison: CTA chest 09/20/2018.

CTA NECK, 04/17/2020 [DATE]: 
CLINICAL INDICATION: Aneurysm. 
A search for DICOM formatted images was conducted for prior CT imaging studies 
completed at a non-affiliated media free facility.
TECHNIQUE: The neck was scanned from the maxillary sinuses through AP window 
with 100 mL of Isovue 370 injected intravenously on a high resolution low dose 
CT scanner using dose reduction techniques.  Routine MPR and MIP 3D renderings 
were reconstructed on an independent workstation with concurrent physician 
supervision. I-STAT GFR prior to imaging was 67.8.

[Series 7: coronal · coronal · 0.43mm/px · 8 of 101 slices shown, 9 images]
[im 16/101  soft-tissue]
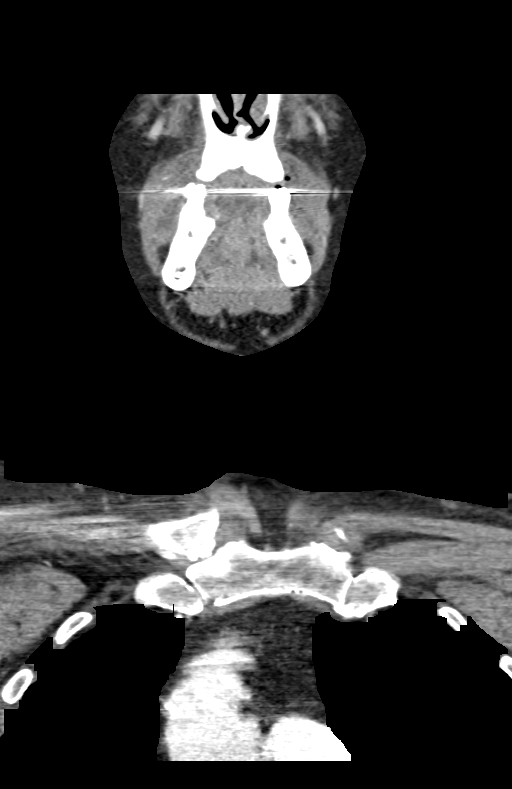
[im 16/101  bone]
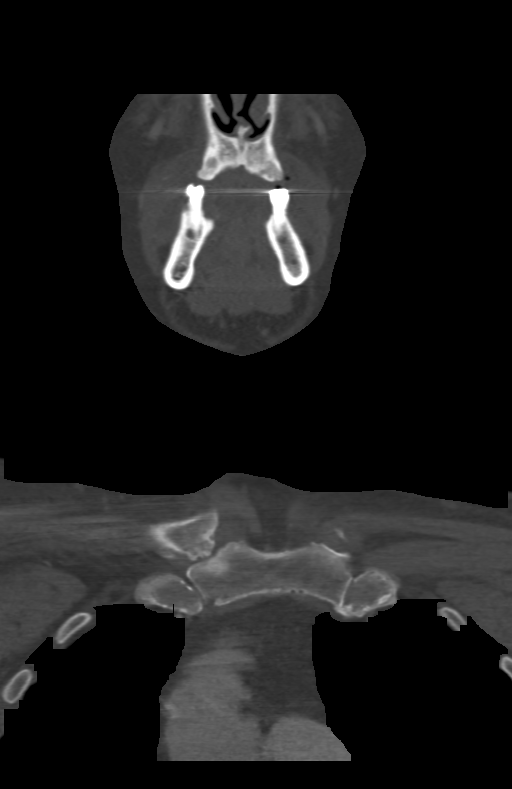
[im 24/101  soft-tissue]
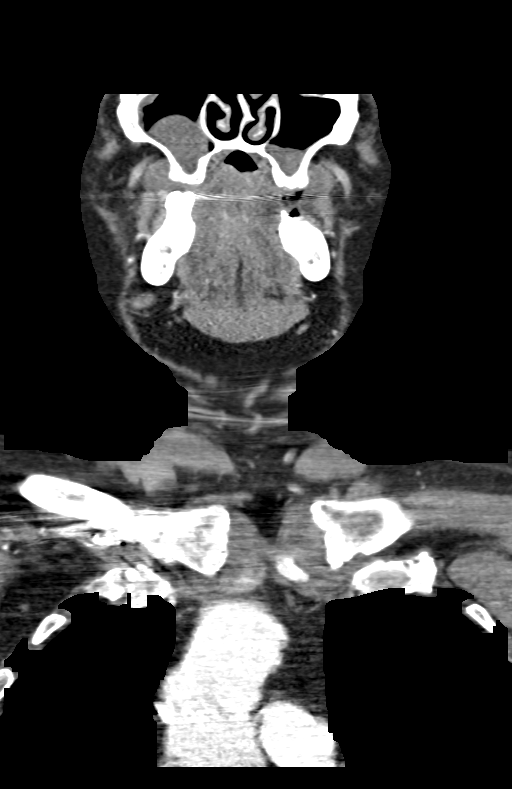
[im 34/101  soft-tissue]
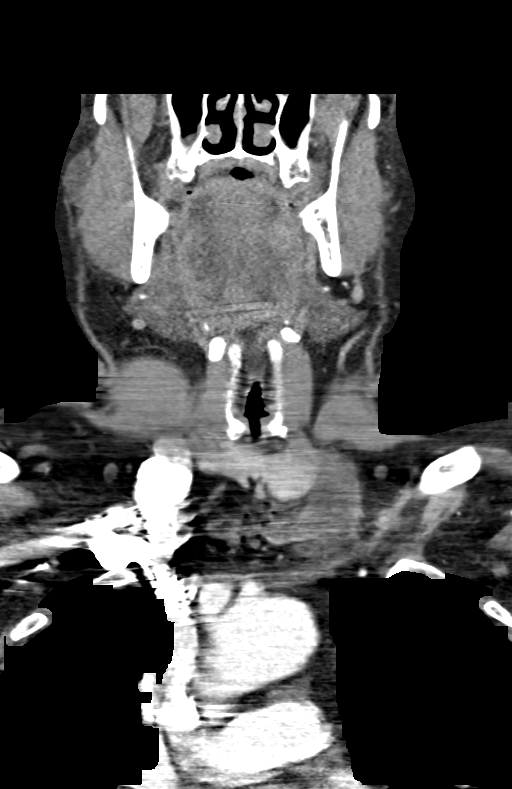
[im 47/101  soft-tissue]
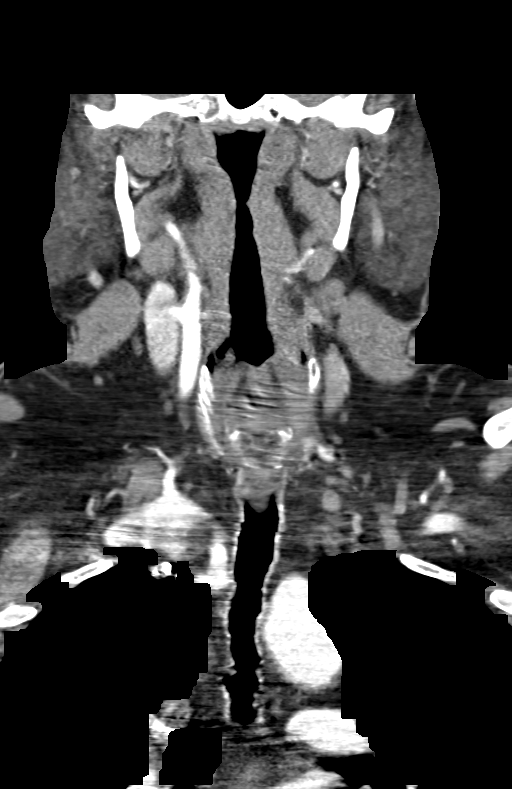
[im 54/101  soft-tissue]
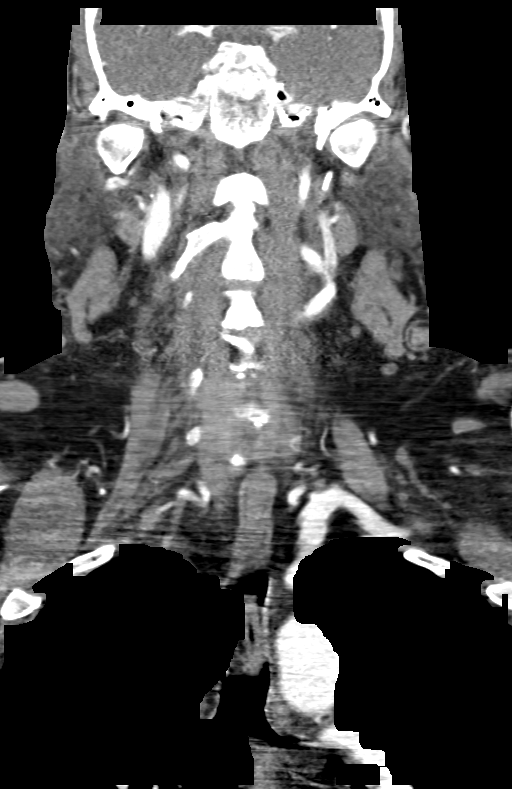
[im 67/101  soft-tissue]
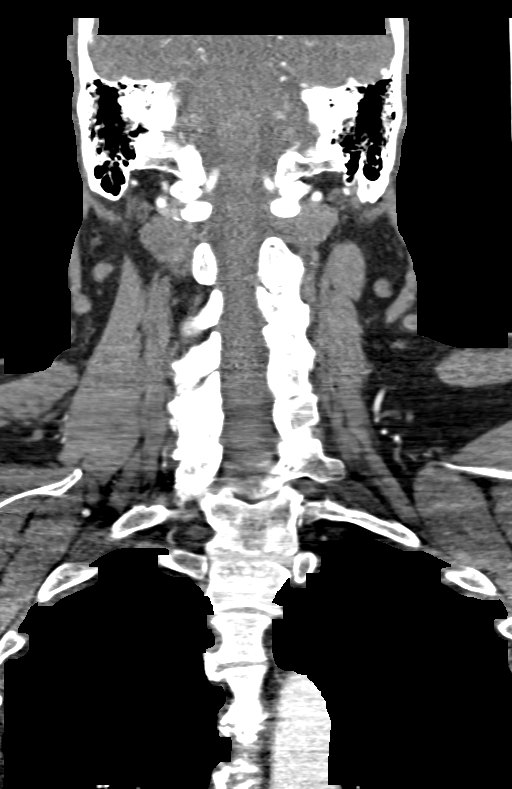
[im 77/101  soft-tissue]
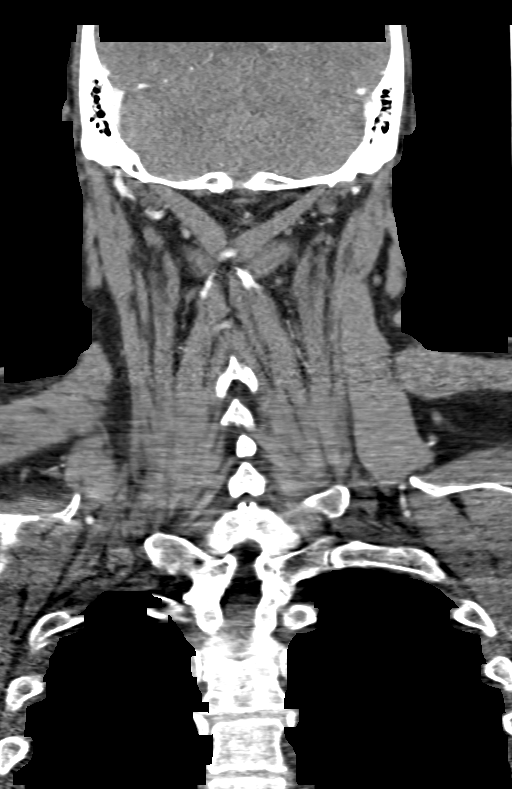
[im 85/101  soft-tissue]
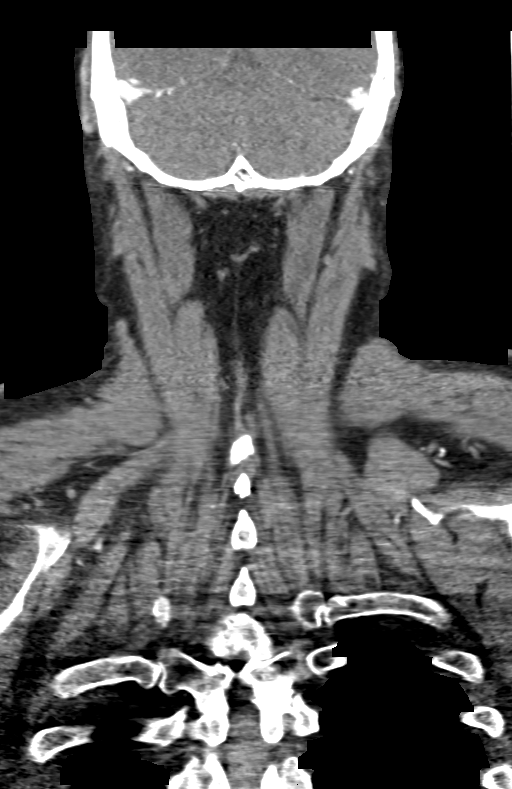

[Series 603: lt carotid obl · coronal · 0.66mm/px · 3 of 40 slices shown]
[im 10/40  soft-tissue]
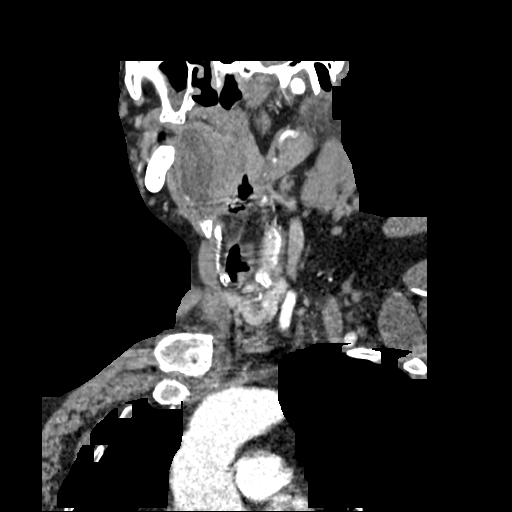
[im 20/40  soft-tissue]
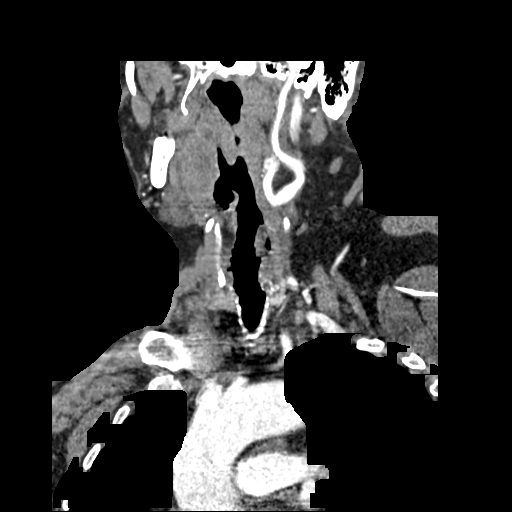
[im 30/40  soft-tissue]
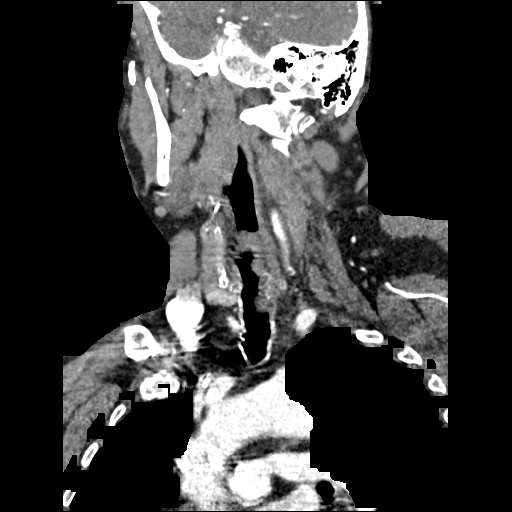

[11 of 37 positions shown; findings below may reference images not displayed]

FINDINGS: AORTIC ARCH: Stable fusiform dilatation ascending aorta measuring approximately 
4.1 cm.  Hepatic great vessels. 
RIGHT CAROTID:  Common carotid artery (CCA) without significant stenosis.. 
Cervical internal carotid artery (ICA) without hemodynamically significant 
stenosis. 
LEFT CAROTID:  Common carotid artery (CCA) without significant stenosis. 
Cervical internal carotid artery (ICA) without hemodynamically significant 
stenosis. 
RIGHT VERTEBRAL ARTERY: Without hemodynamically significant stenosis. 
LEFT VERTEBRAL ARTERY: Without hemodynamically significant stenosis. 
NECK AND UPPER THORAX: Mild/moderate spondylosis cervical spine.
IMPRESSION: No significant focal intracranial stenosis or evidence of intracranial aneurysm. 
No significant stenosis right internal carotid artery. 
No significant stenosis left internal carotid artery. 
No significant stenosis right vertebral artery. 
No significant stenosis left vertebral artery. 
Measurements performed utilizing the NASCET criteria. 
RADIATION DOSE REDUCTION: All CT scans are performed using radiation dose 
reduction techniques, when applicable.  Technical factors are evaluated and 
adjusted to ensure appropriate moderation of exposure.  Automated dose 
management technology is applied to adjust the radiation doses to minimize 
exposure while achieving diagnostic quality images.

## 2021-03-31 IMAGING — CT CT CHEST WITHOUT CONTRAST
2 of 4 series · 15 of 36 positions shown, 18 images · non-contrast
Comparison: 03/29/2019.

________________________________________________________________________________________________ 
CT CHEST WITHOUT CONTRAST, 03/31/2021 [DATE]: 
CLINICAL INDICATION: History of multiple pulmonary emboli. 
A search for DICOM formatted images was conducted for prior CT imaging studies 
completed at a non-affiliated media free facility.
TECHNIQUE: The chest was scanned from base of neck through the lung bases 
without contrast on a high resolution low dose CT scanner. Routine MPR and MIP 
3D renderings were reconstructed on an independent workstation with concurrent 
physician supervision.

[Series 2: chest w/o 2.0 i31s 3 · axial · non-contrast · 0.94mm/px · z∈[-361,-55]mm · 12 of 181 slices shown, 15 images]
[im 14/181  mediastinal]
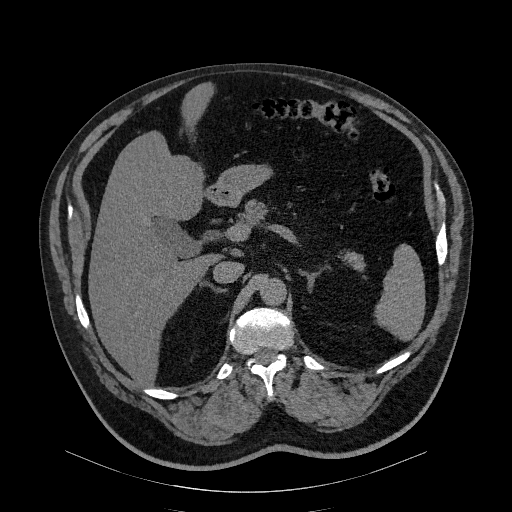
[im 14/181  lung]
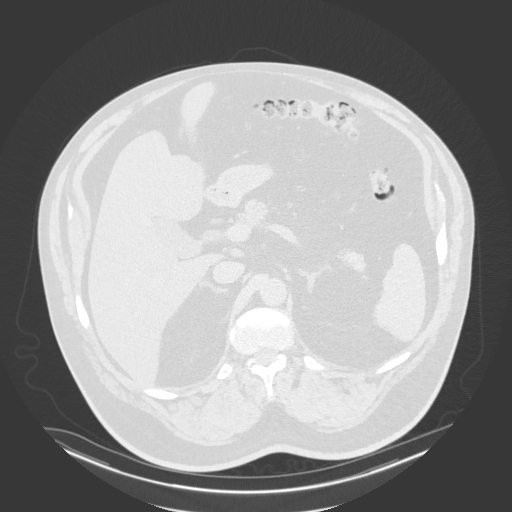
[im 28/181  lung]
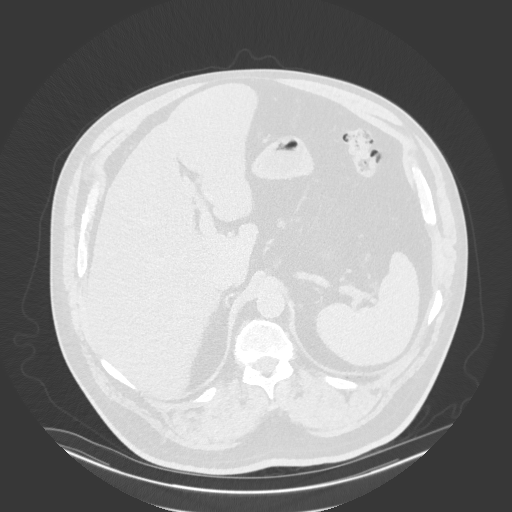
[im 42/181  lung]
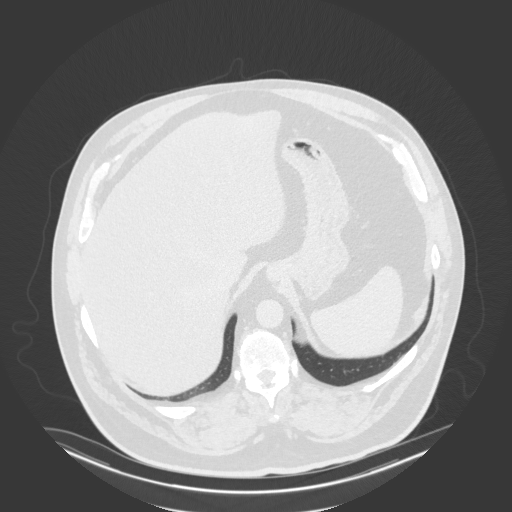
[im 56/181  lung]
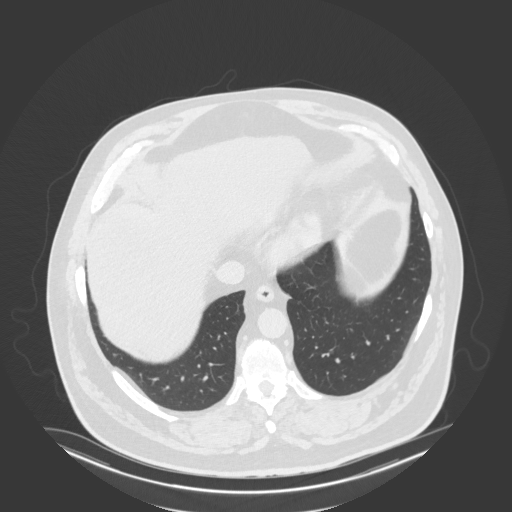
[im 70/181  mediastinal]
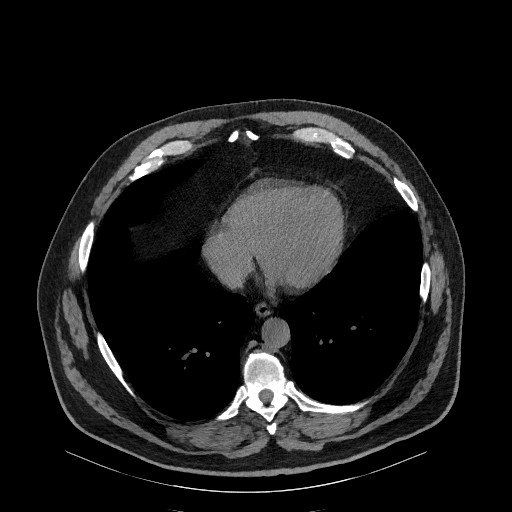
[im 70/181  lung]
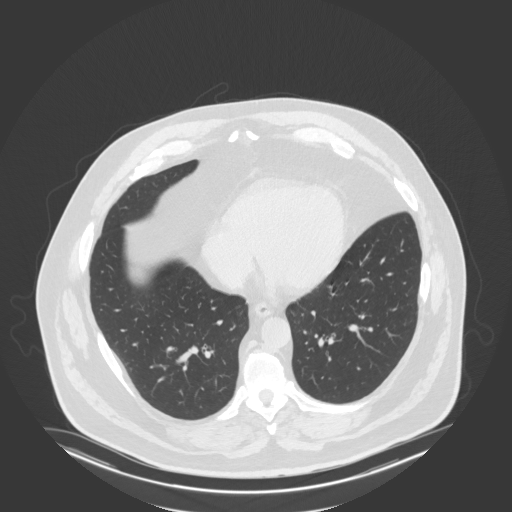
[im 84/181  lung]
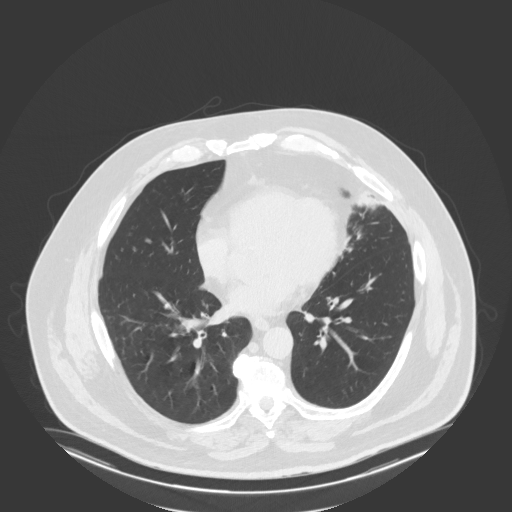
[im 97/181  lung]
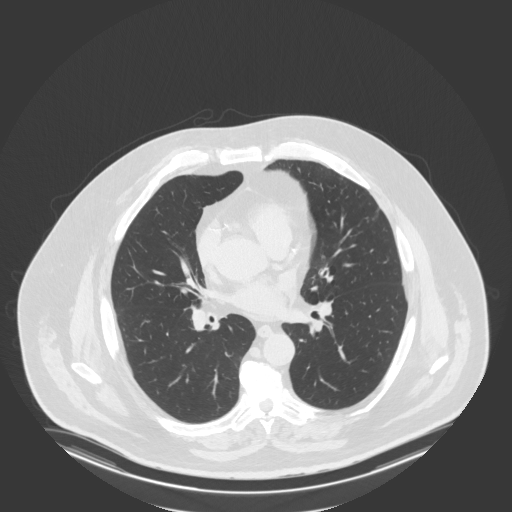
[im 111/181  lung]
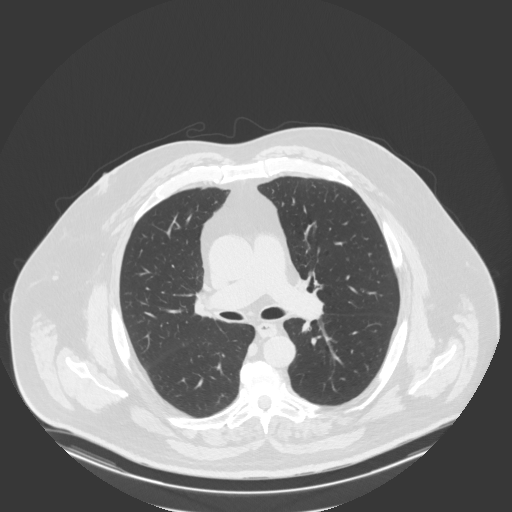
[im 125/181  mediastinal]
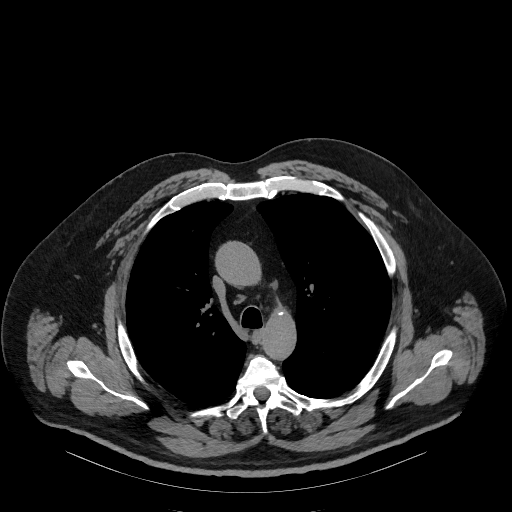
[im 125/181  lung]
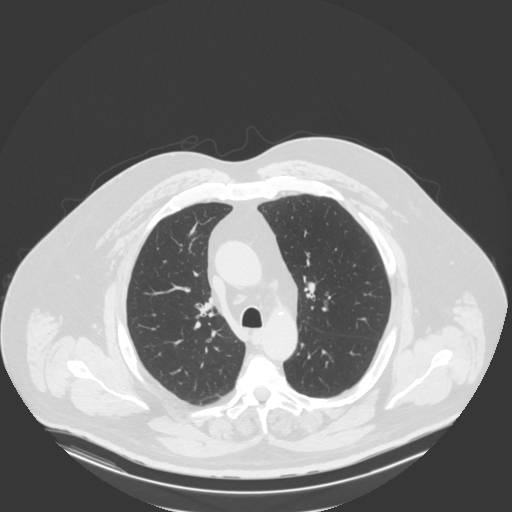
[im 139/181  lung]
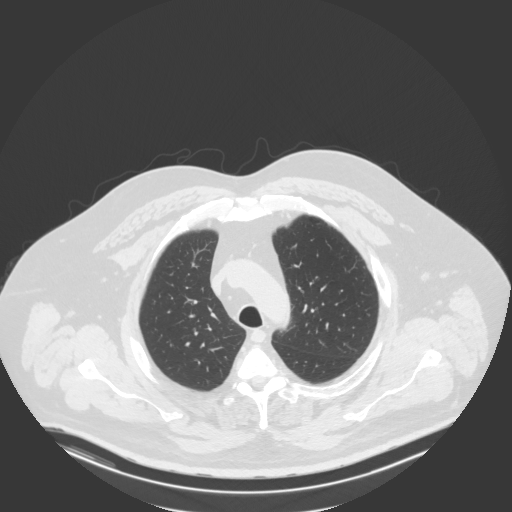
[im 153/181  lung]
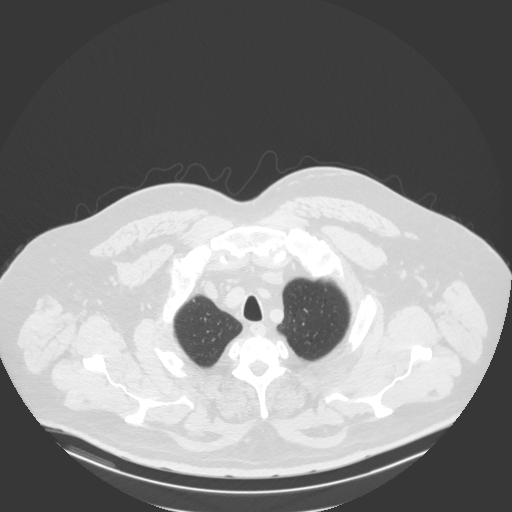
[im 167/181  lung]
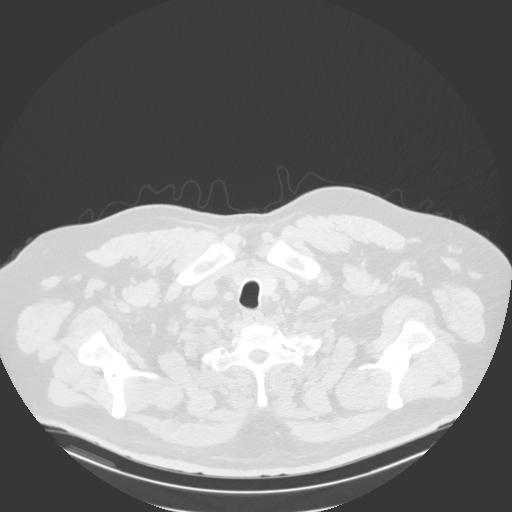

[Series 5: coronal · coronal · 0.72mm/px · 3 of 192 slices shown]
[im 39/192  lung]
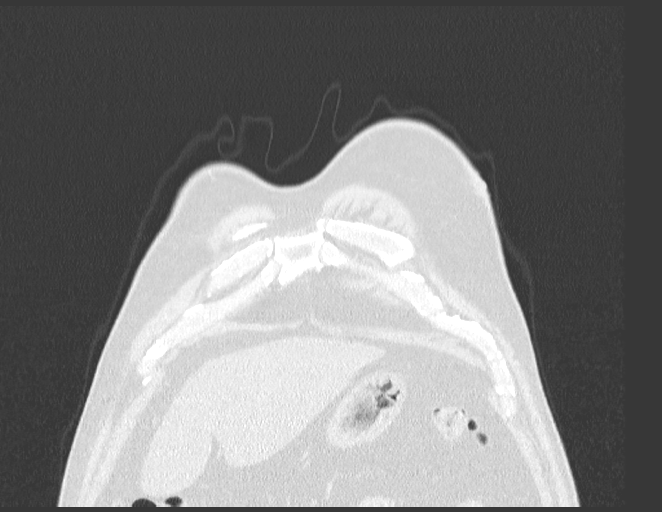
[im 77/192  lung]
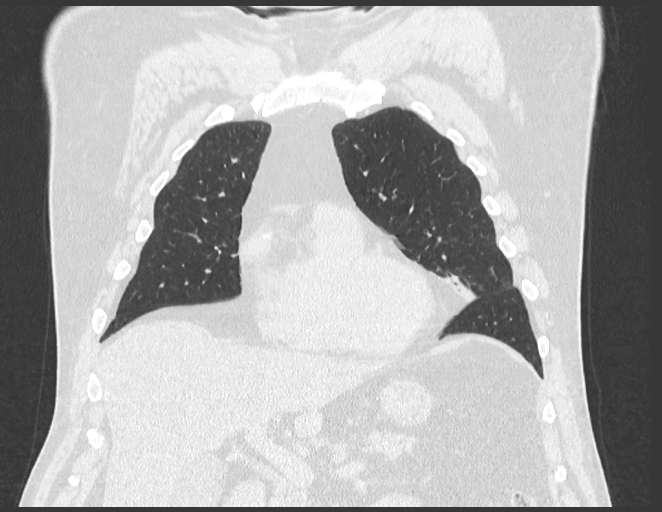
[im 115/192  lung]
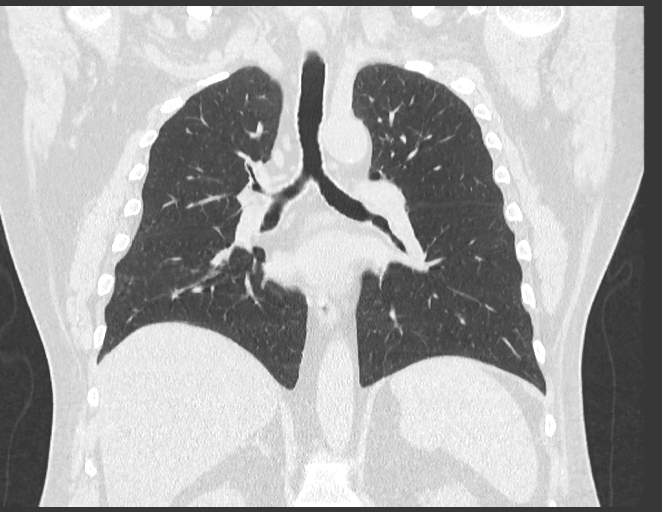

[15 of 36 positions shown; findings below may reference images not displayed]

FINDINGS: LUNGS AND PLEURA:  Mild peribronchial thickening with scattered areas of 
atelectasis that is greatest in the lingula but no acute consolidation. No 
suspicious pulmonary nodule. 
MEDIASTINUM:  No adenopathy. Normal heart size. No pericardial effusion. Main 
pulmonary artery is of normal caliber. Moderate coronary artery calcification. 
Mild fusiform dilatation ascending aorta measuring CM. 
CHEST WALL/AXILLA: No mass or adenopathy.  
UPPER ABDOMEN: Fatty infiltration. Stable small left adrenal nodule measuring 
approximately 10 mm. 
MUSCULOSKELETAL: No acute abnormality. Degenerative change.
IMPRESSION: Main pulmonary artery is of normal caliber. 
Mild peribronchial thickening with scattered areas of atelectasis which are 
greatest within the lingula. No radiographic evidence of CTEPH. 
No mediastinal or hilar adenopathy. 
RADIATION DOSE REDUCTION: All CT scans are performed using radiation dose 
reduction techniques, when applicable.  Technical factors are evaluated and 
adjusted to ensure appropriate moderation of exposure.  Automated dose 
management technology is applied to adjust the radiation doses to minimize 
exposure while achieving diagnostic quality images.

## 2021-05-07 IMAGING — CT CTA CHEST
2 of 5 series · 15 of 46 positions shown, 17 images · IV contrast (APPLIED)
Comparison: CT exam of 03/31/2021 and dating back to 06/08/2017

________________________________________________________________________________________________ 
CTA CHEST, 05/07/2021 [DATE]: 
CLINICAL INDICATION: Aortic aneurysm. Smoking cessation 5 years ago. Aortic 
aneurysm of unspecified site 
A search for DICOM formatted images was conducted for prior CT imaging studies 
completed at a non-affiliated media free facility.
TECHNIQUE: The chest was scanned from base of neck through the lung bases with 
100 mL of Isovue 370  injected intravenously on a high resolution low dose CT 
scanner using dose reduction techniques.  Routine MPR and MIP 3D renderings were 
reconstructed on an independent workstation with concurrent physician 
supervision. The patients eGFR was calculated to be 43.9 mL/min/1.73 m2 using 
the i-STAT device.

[Series 6: cta chest st thins · axial · 0.91mm/px · z∈[-290,-22]mm · 12 of 588 slices shown, 14 images]
[im 26/588  soft-tissue]
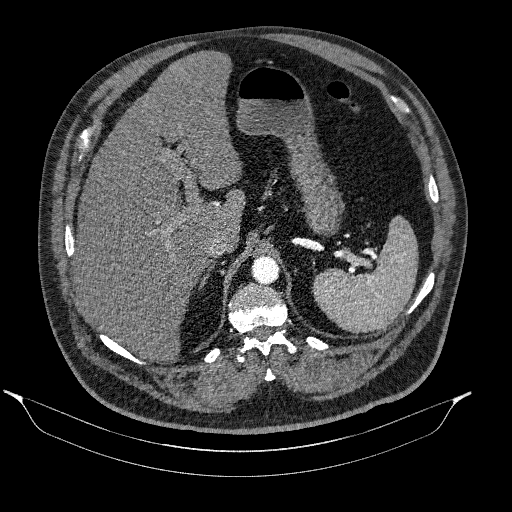
[im 26/588  bone]
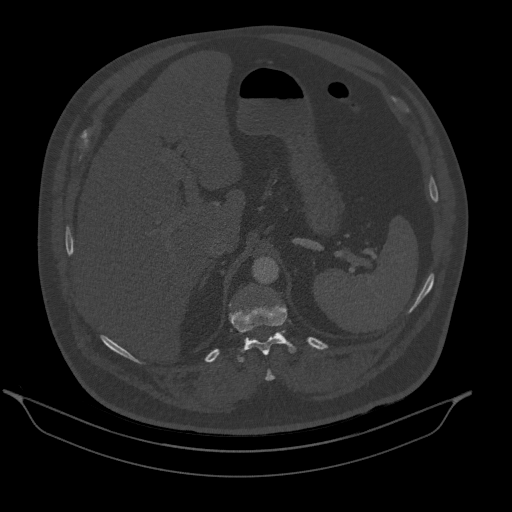
[im 77/588  soft-tissue]
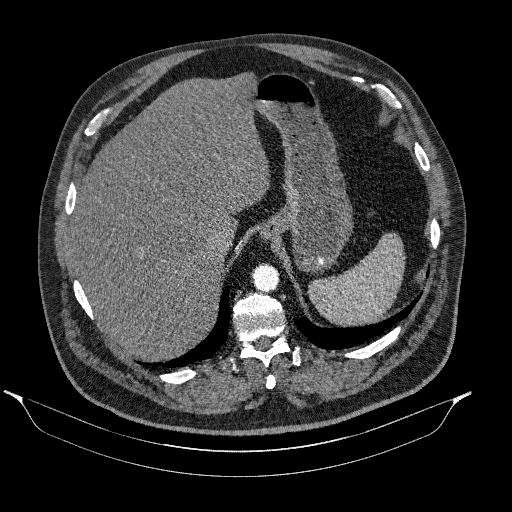
[im 128/588  soft-tissue]
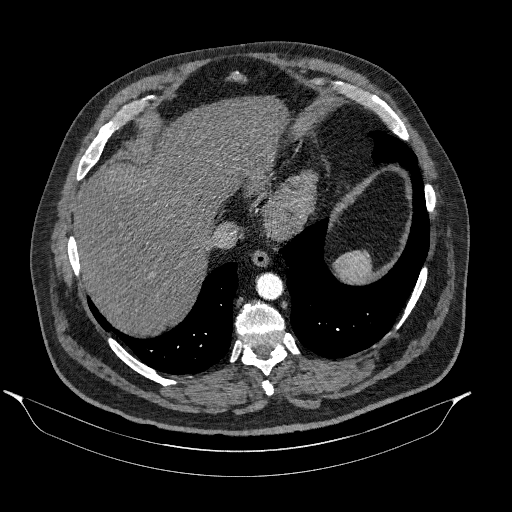
[im 179/588  soft-tissue]
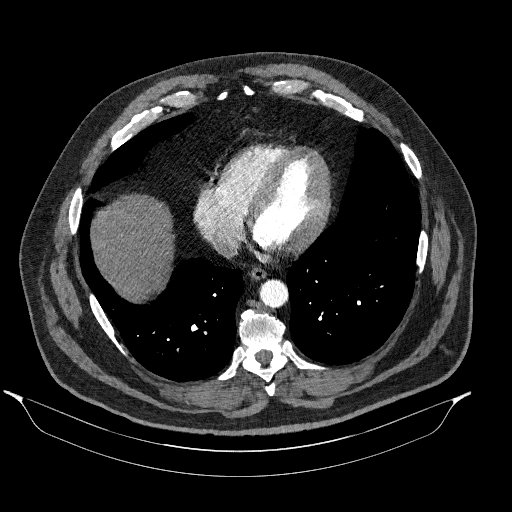
[im 230/588  soft-tissue]
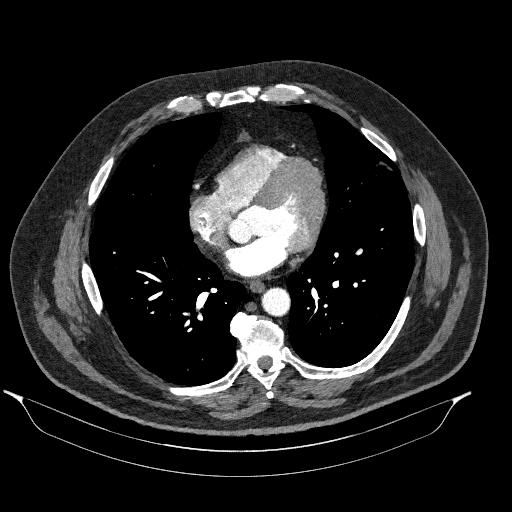
[im 281/588  soft-tissue]
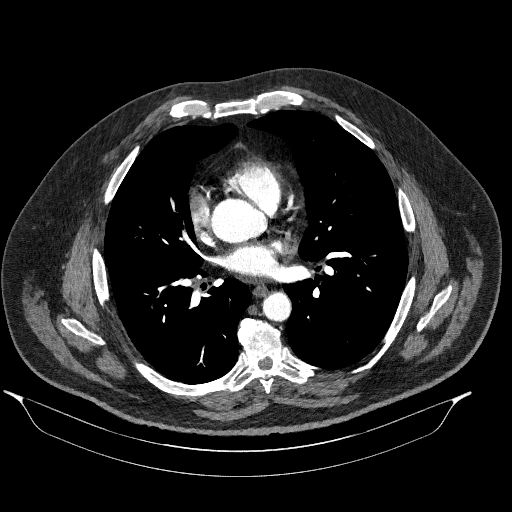
[im 307/588  soft-tissue]
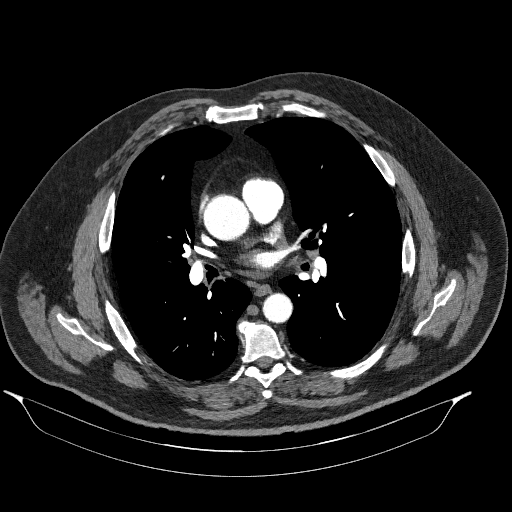
[im 358/588  soft-tissue]
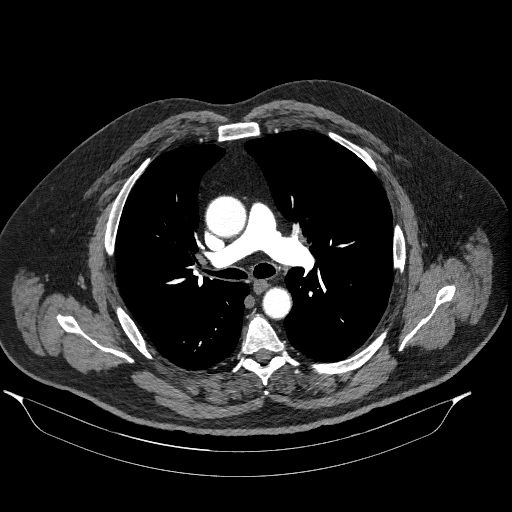
[im 409/588  soft-tissue]
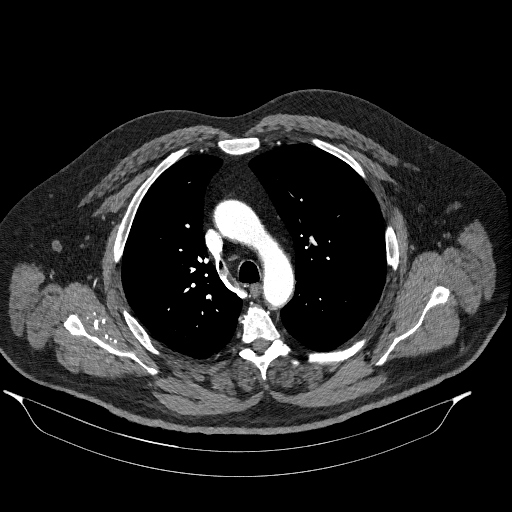
[im 409/588  bone]
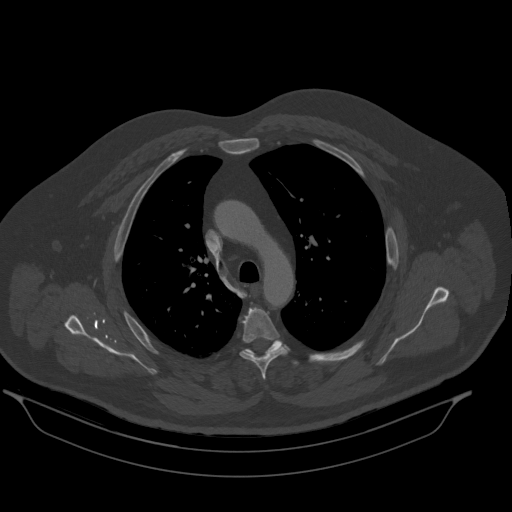
[im 460/588  soft-tissue]
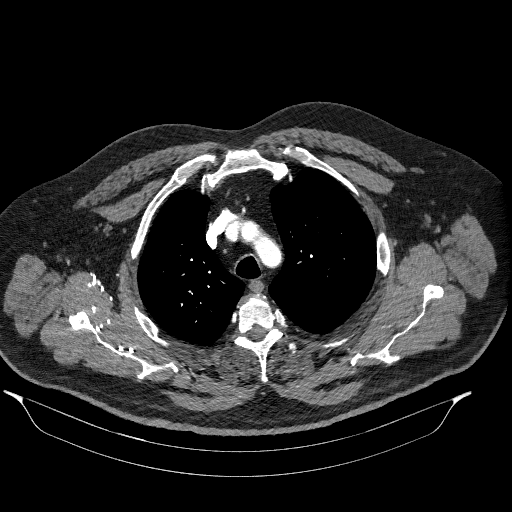
[im 511/588  soft-tissue]
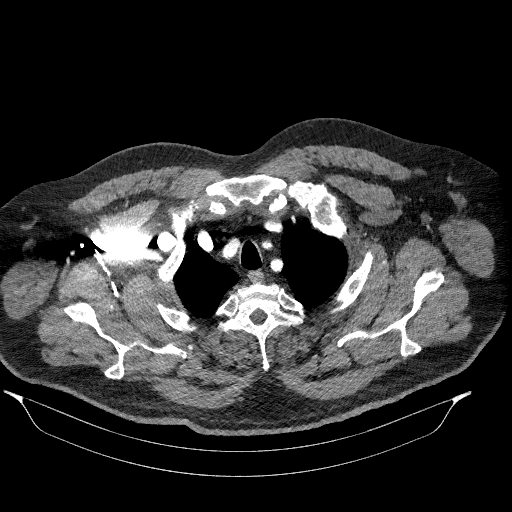
[im 562/588  soft-tissue]
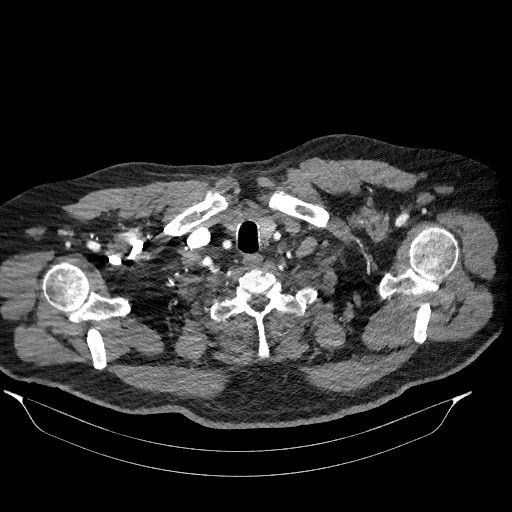

[Series 7: coronal · coronal · 0.59mm/px · 3 of 176 slices shown]
[im 59/176  soft-tissue]
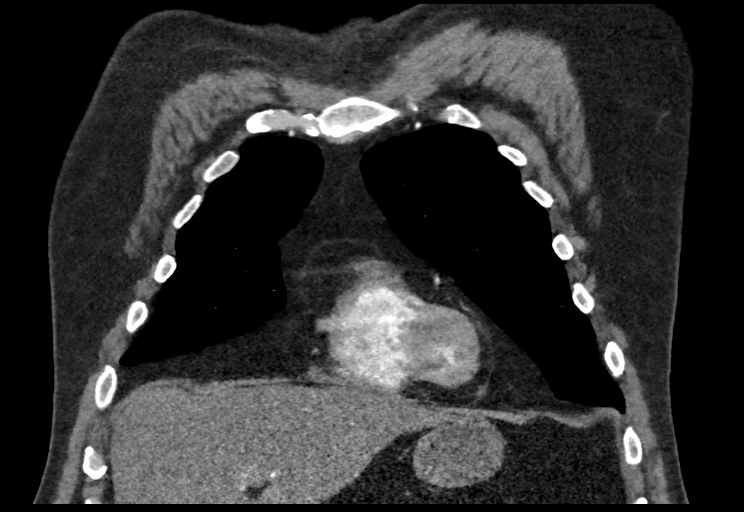
[im 78/176  soft-tissue]
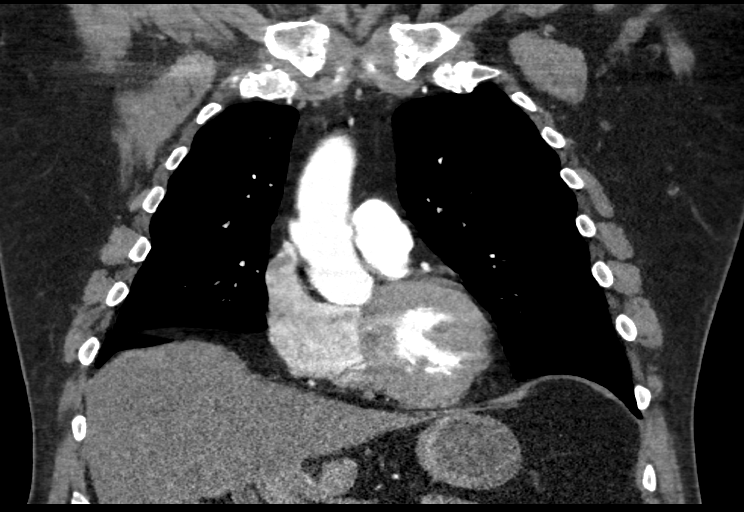
[im 98/176  soft-tissue]
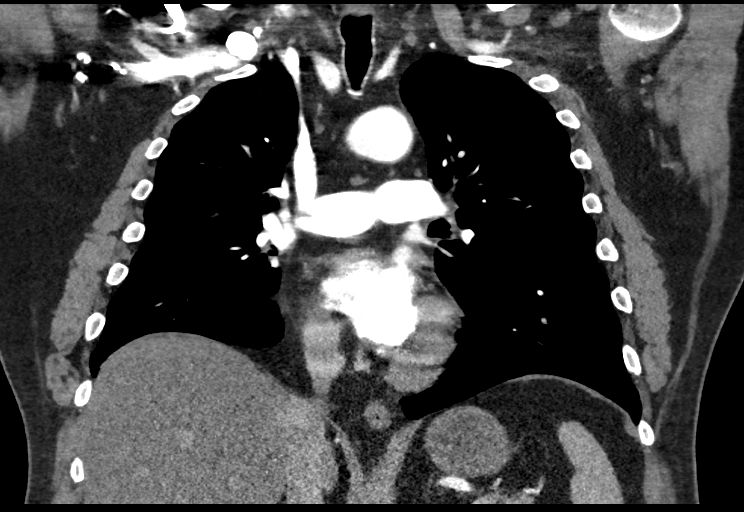

[15 of 46 positions shown; findings below may reference images not displayed]

FINDINGS: PULMONARY ARTERIES: There is a small filling defect within the left distal main 
pulmonary artery for example on series 4, image 57. Additionally, there is a 
filling defect extending from the right lower lobe pulmonary artery extending 
inferiorly as seen for example on series 4, image 74. Findings are compatible 
with acute pulmonary embolism. No bowing of the interventricular septum. No 
reflux of contrast within the hepatic veins. 
LUNGS AND PLEURA: Lungs clear.   No effusions.   
LYMPH NODES: No adenopathy. 
HEART: Normal in size.  No pericardial effusion.  
AORTA AND GREAT VESSELS: Ascending aorta measures up to 4 cm in cross-section, 
unchanged. 
OSSEOUS STRUCTURES: No acute fracture or destructive lesion.   
UPPER ABDOMEN: Included portions of the upper abdomen includes hepatic 
steatosis.
IMPRESSION: Small bilateral acute pulmonary embolism.  
Findings were discussed with the referring service, by my self at the time of 
this dictation. 
RADIATION DOSE REDUCTION: All CT scans are performed using radiation dose 
reduction techniques, when applicable.  Technical factors are evaluated and 
adjusted to ensure appropriate moderation of exposure.  Automated dose 
management technology is applied to adjust the radiation doses to minimize 
exposure while achieving diagnostic quality images.

## 2021-07-10 IMAGING — DX KNEE 3 VIEWS LEFT
1 series · 3 of 3 positions shown · non-contrast
Comparison: None

________________________________________________________________________________________________ 
KNEE 3 VIEWS LEFT, 07/10/2021 [DATE]: 
CLINICAL INDICATION:  Knee pain, evaluate for arthritis

[Series 1: AP · 0.14mm/px · 3 of 3 slices shown]
[im 1/3]
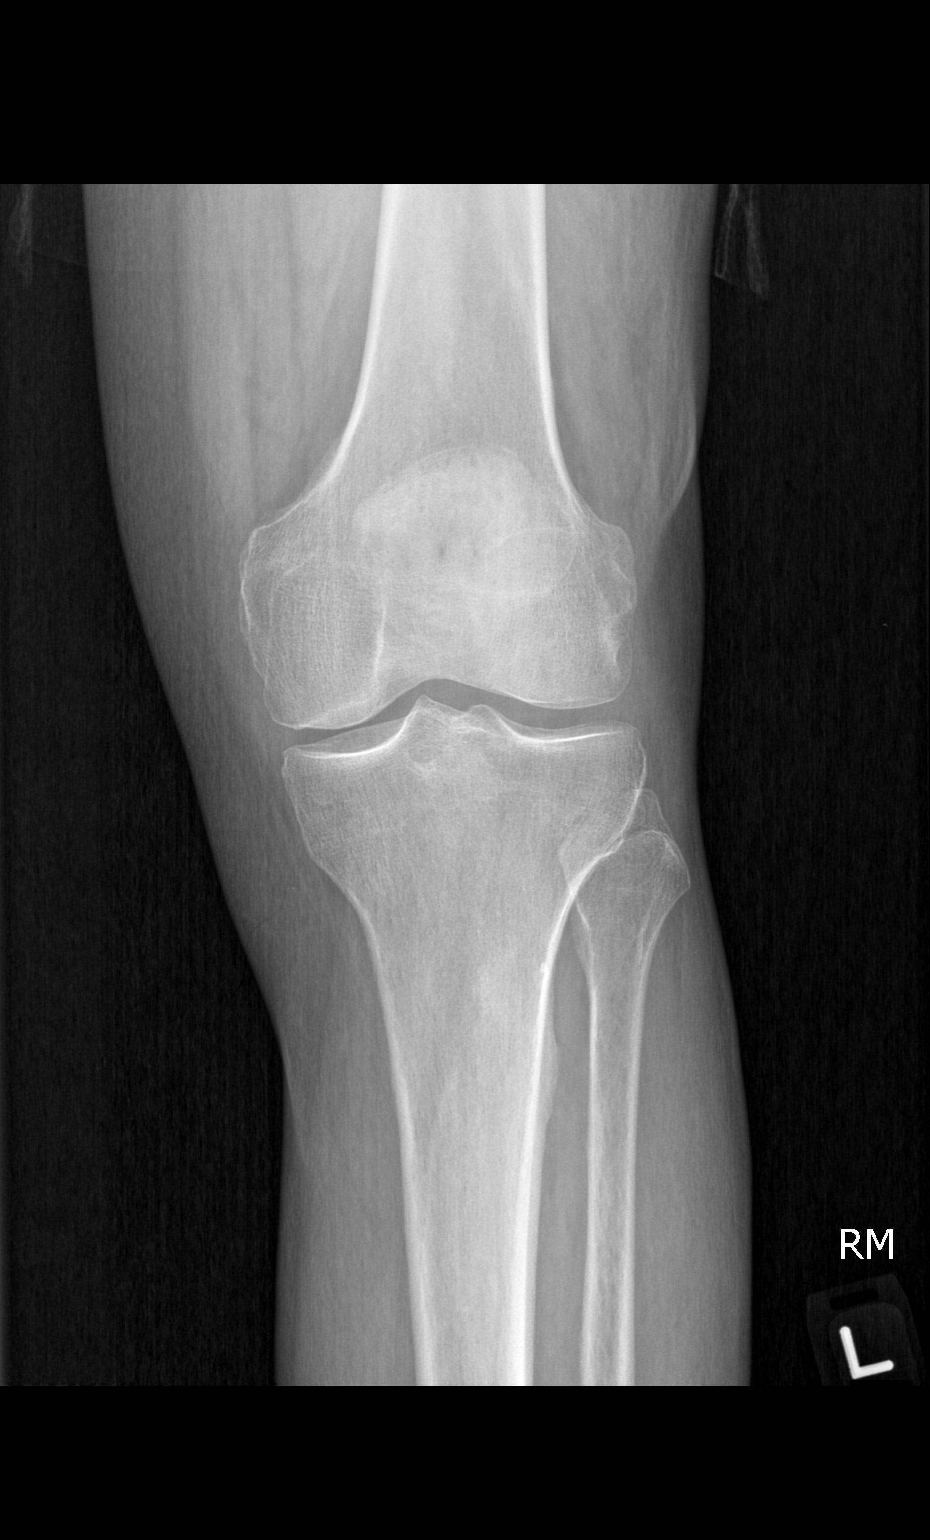
[im 2/3]
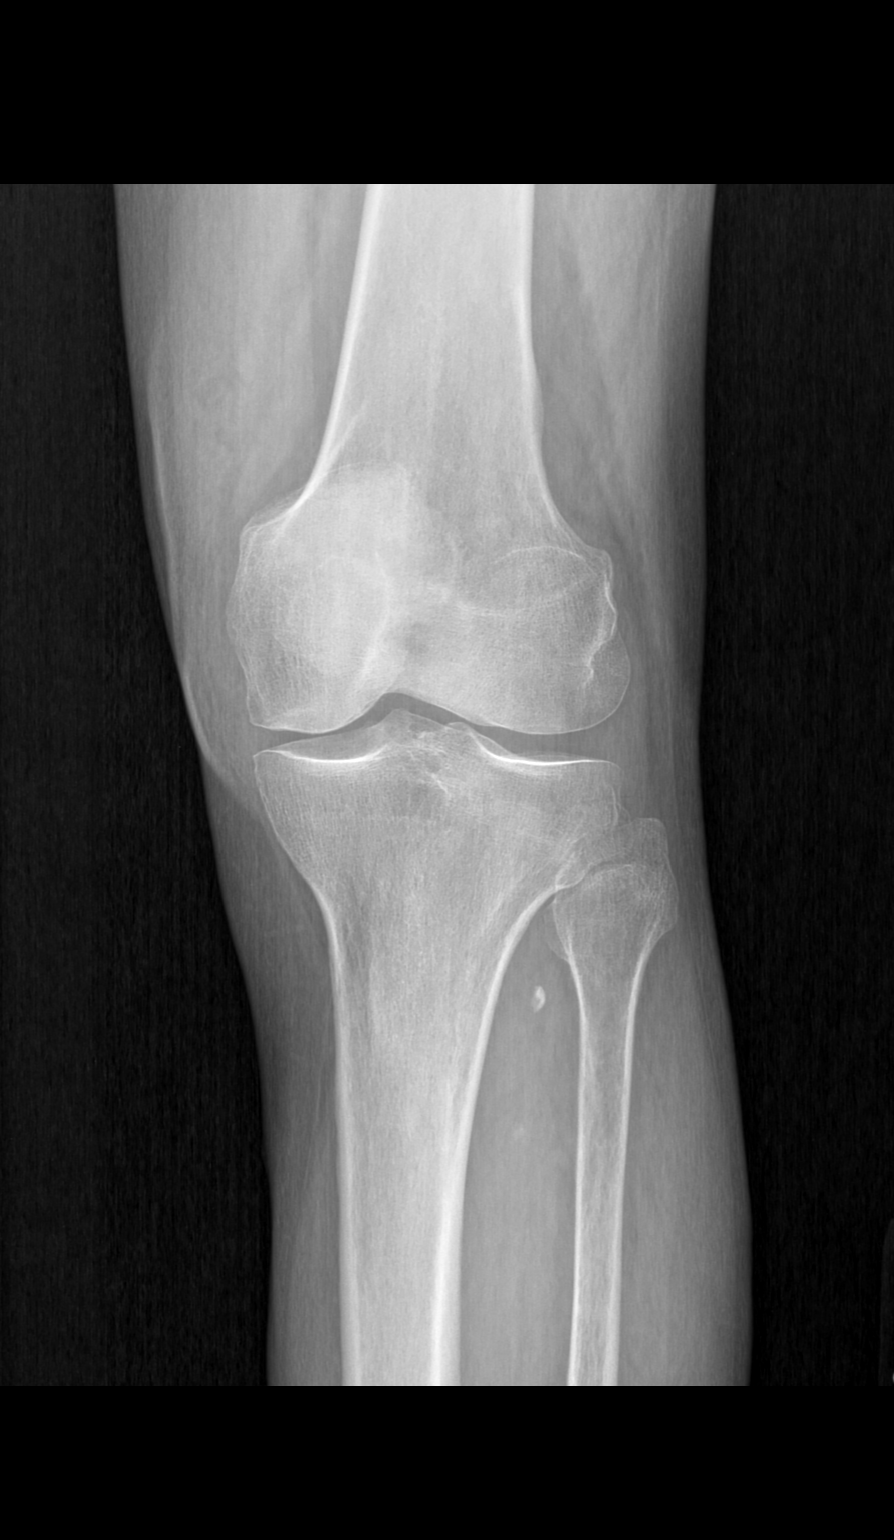
[im 3/3]
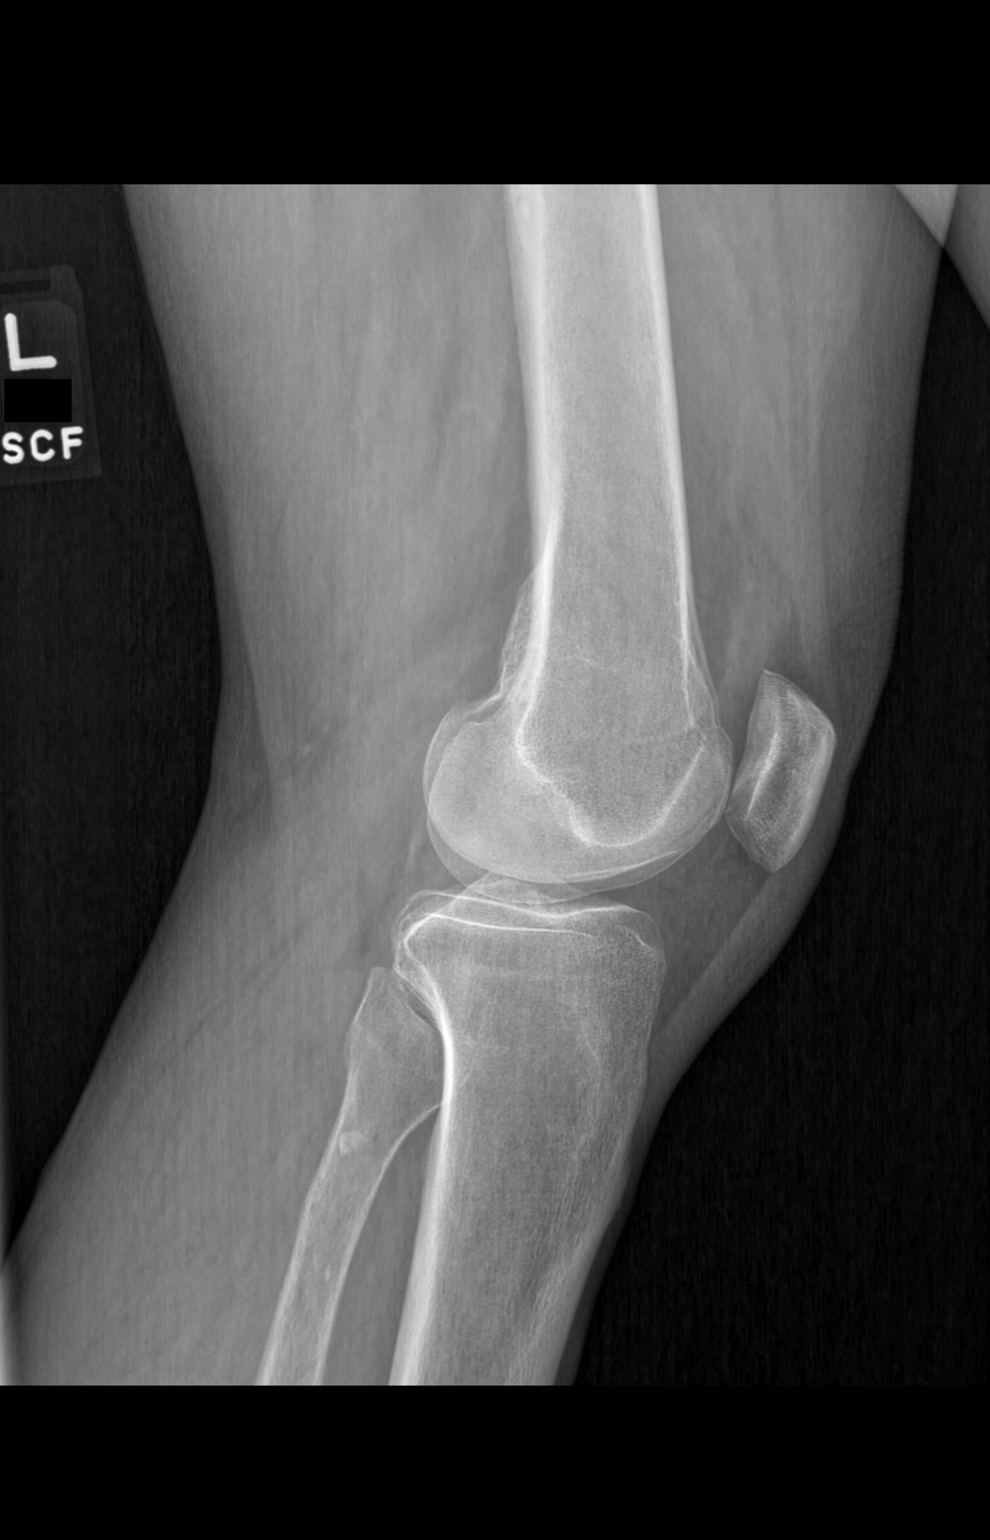

[3 of 3 positions shown; findings below may reference images not displayed]

FINDINGS: There are minor degenerative changes. No effusion, fracture or bone 
destruction.
IMPRESSION: Minor degenerative changes of the left knee..

## 2021-07-10 IMAGING — DX KNEE 3 VIEWS RIGHT
1 series · 3 of 3 positions shown · non-contrast
Comparison: None

________________________________________________________________________________________________ 
KNEE 3 VIEWS RIGHT, 07/10/2021 [DATE]: 
CLINICAL INDICATION:  Knee pain, evaluate for arthritis

[Series 1: AP · 0.14mm/px · 3 of 3 slices shown]
[im 1/3]
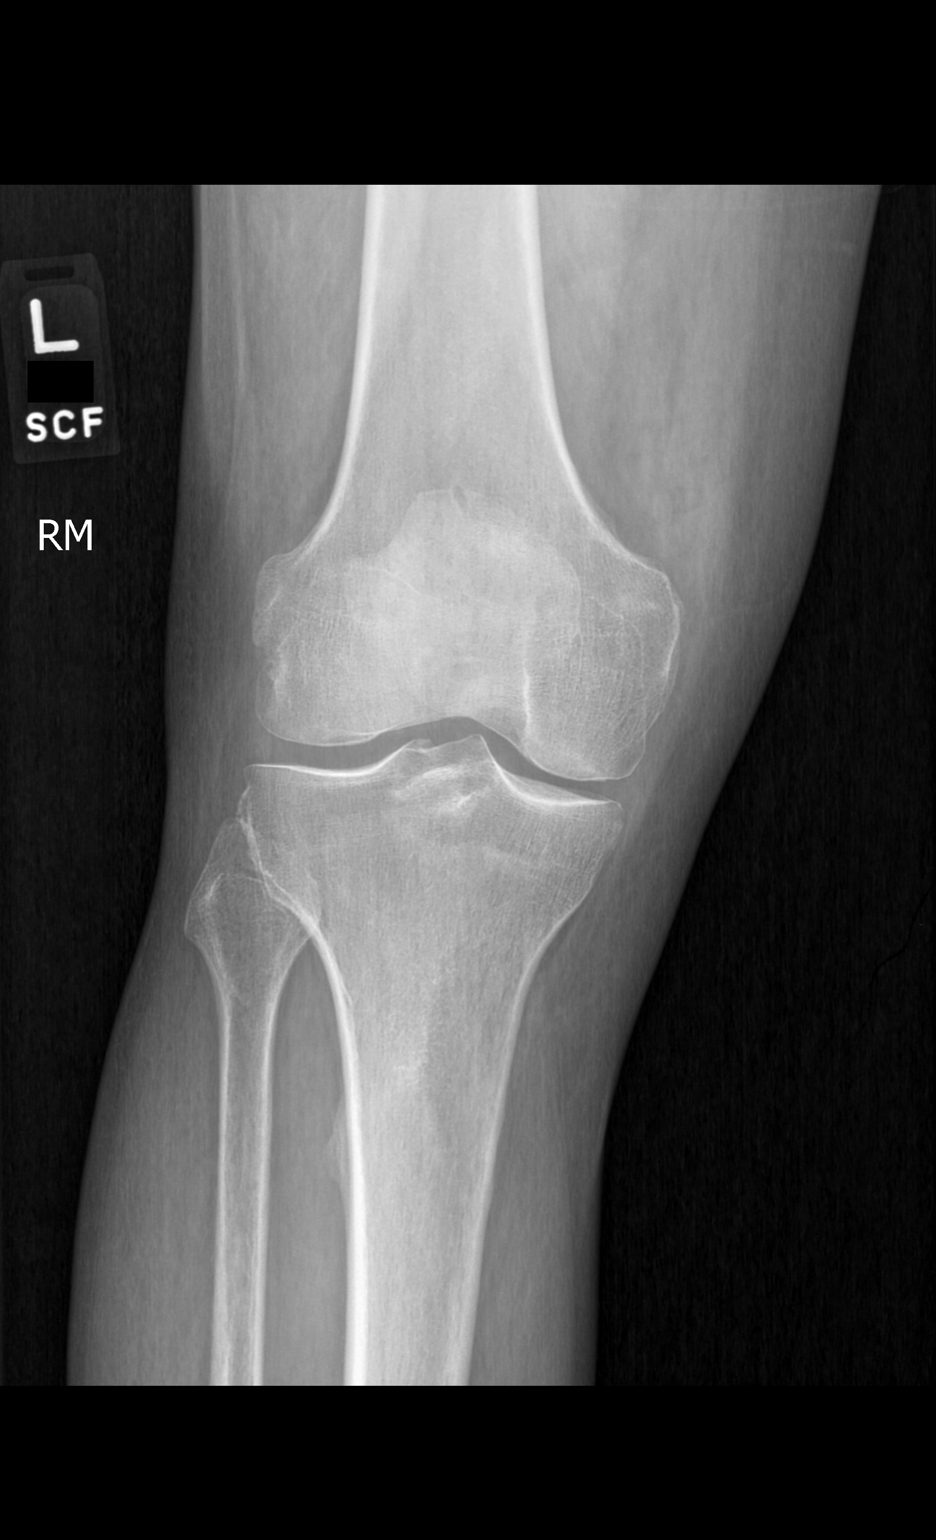
[im 2/3]
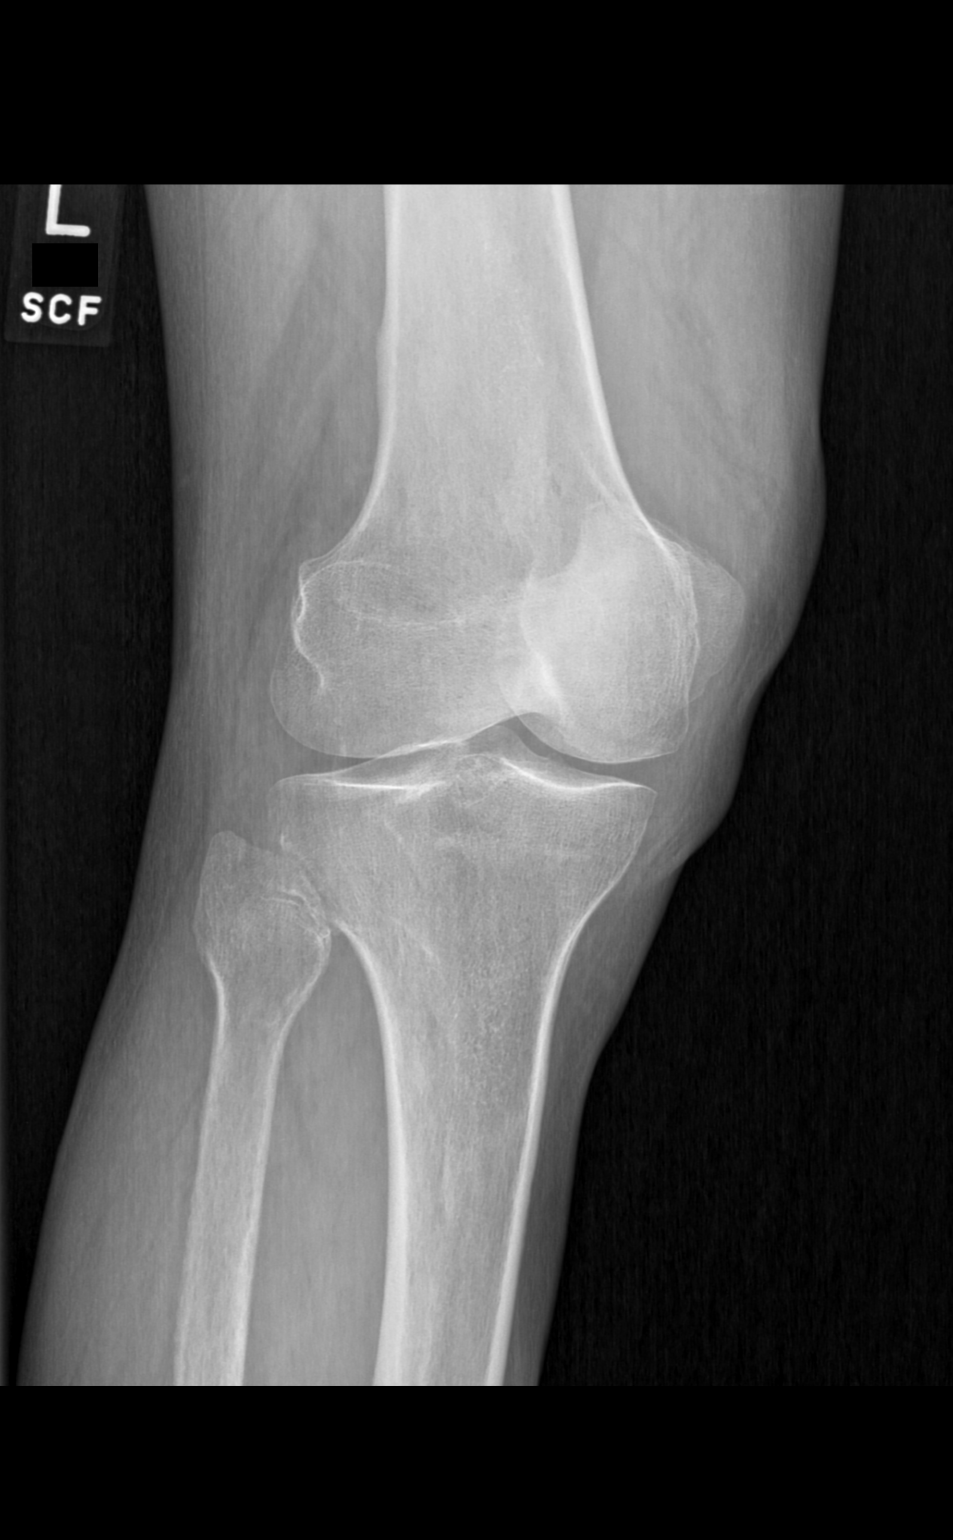
[im 3/3]
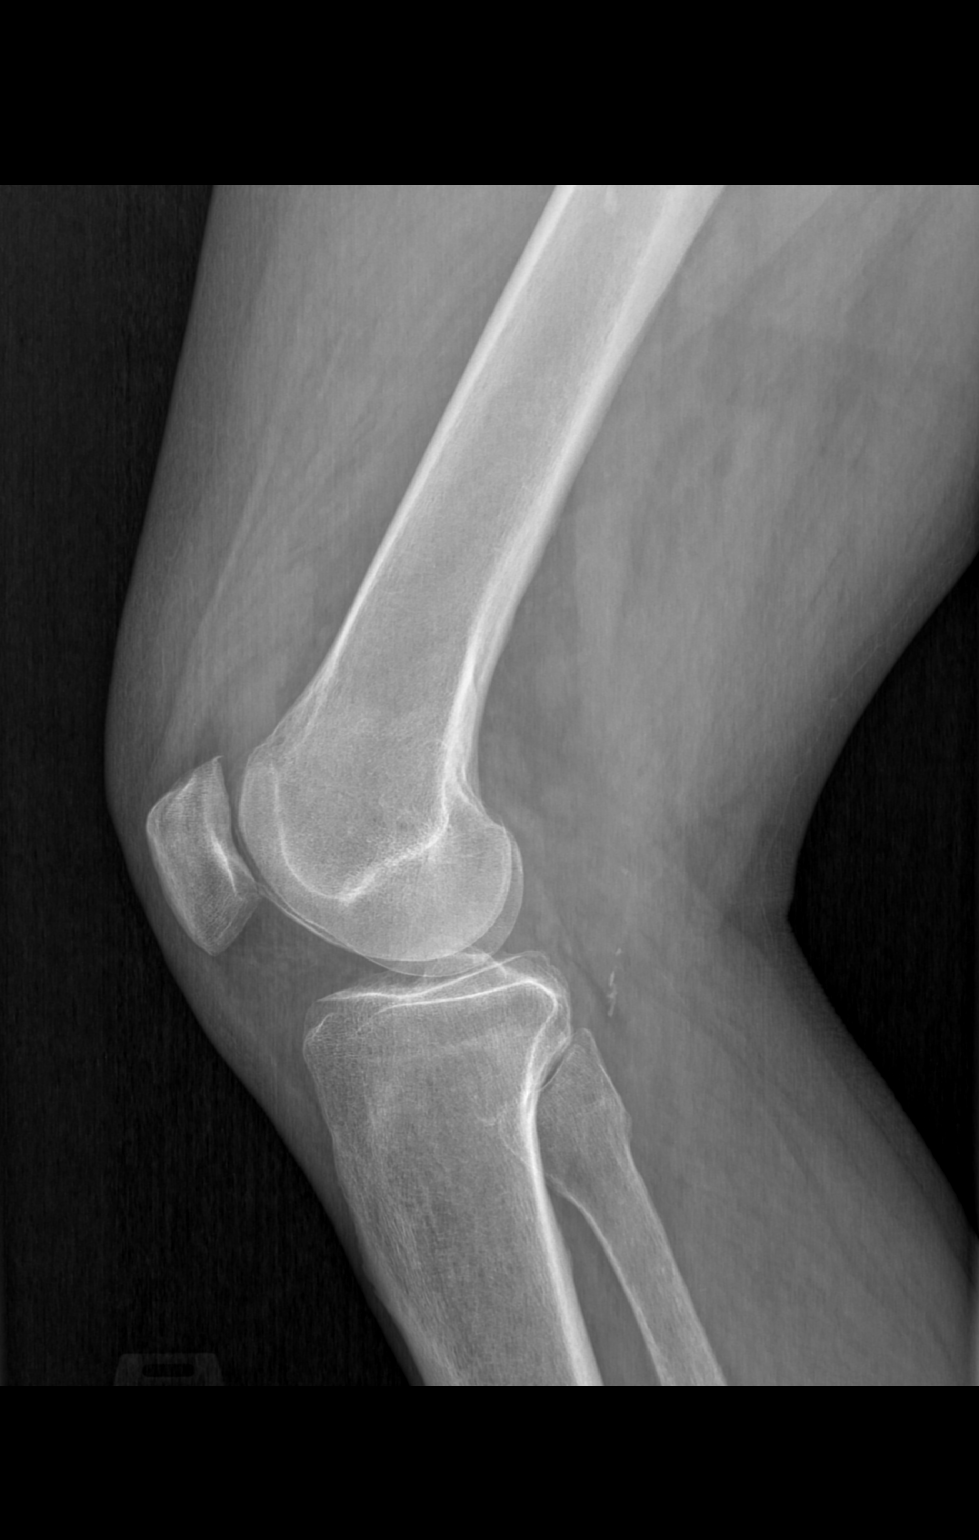

[3 of 3 positions shown; findings below may reference images not displayed]

FINDINGS: There are minor degenerative changes. Tiny spur along the upper 
patella. No bone destruction or fracture. Small.
IMPRESSION: Small effusion. Minor degenerative changes..

## 2021-08-05 IMAGING — CT CTA CHEST
1 of 3 series · 12 of 32 positions shown, 18 images · IV contrast (agent unspecified)
Comparison: 05/07/2021.

________________________________________________________________________________________________ 
CTA CHEST, 08/05/2021 [DATE]: 
CLINICAL INDICATION: History of pulmonary embolus. 
A search for DICOM formatted images was conducted for prior CT imaging studies 
completed at a non-affiliated media free facility.
TECHNIQUE: The chest was scanned from base of neck through the lung bases with 
Contrast amount ml of Contrast type  injected intravenously on a high resolution 
low dose CT scanner using dose reduction techniques. Routine MPR and MIP 3D 
renderings were reconstructed.

[Series 4: cta chest · axial · 0.91mm/px · z∈[-241,+59]mm · 12 of 238 slices shown, 18 images]
[im 19/238  soft-tissue]
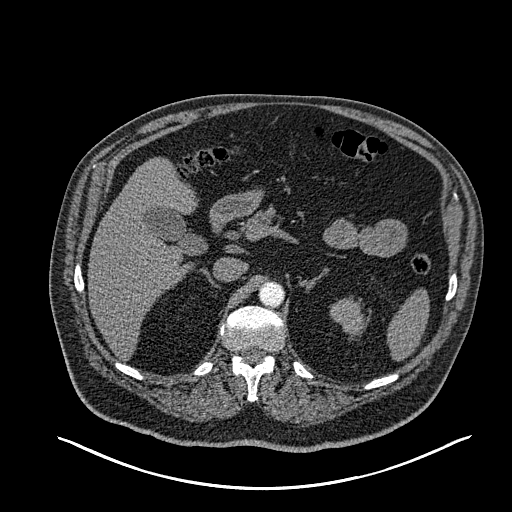
[im 19/238  bone]
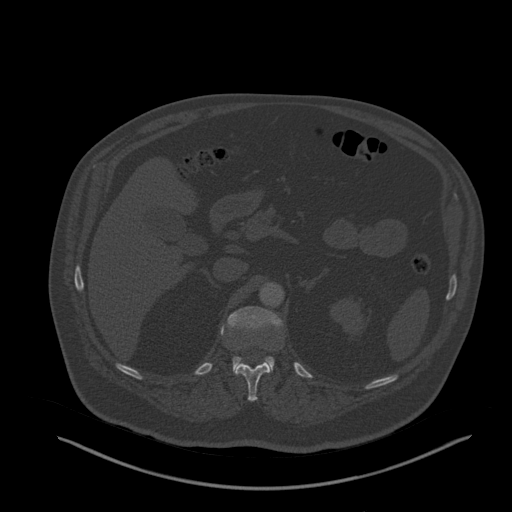
[im 37/238  soft-tissue]
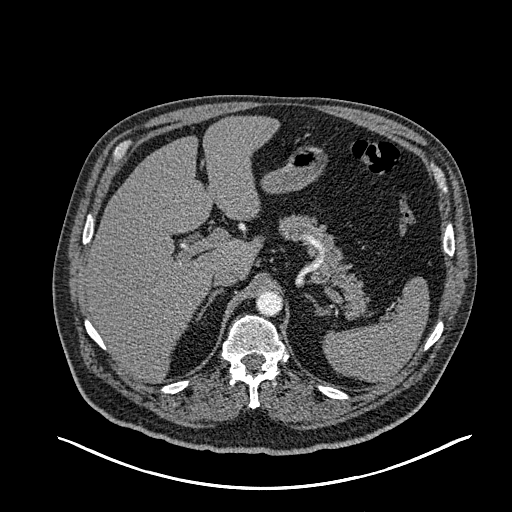
[im 55/238  soft-tissue]
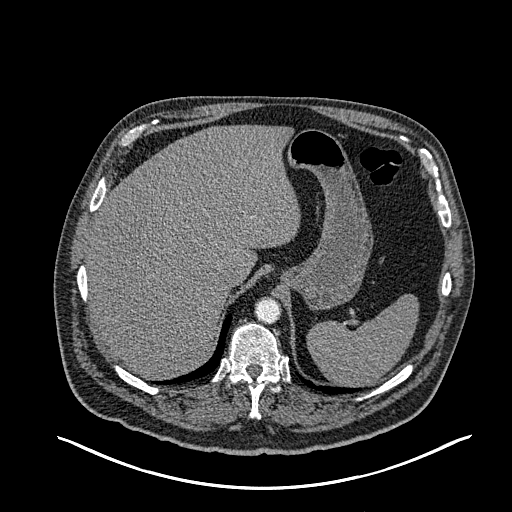
[im 73/238  soft-tissue]
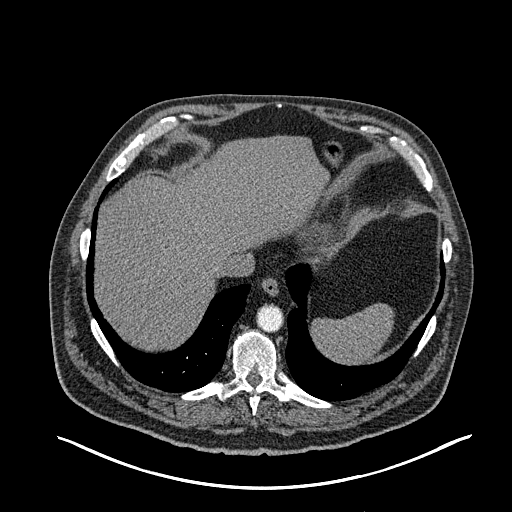
[im 92/238  soft-tissue]
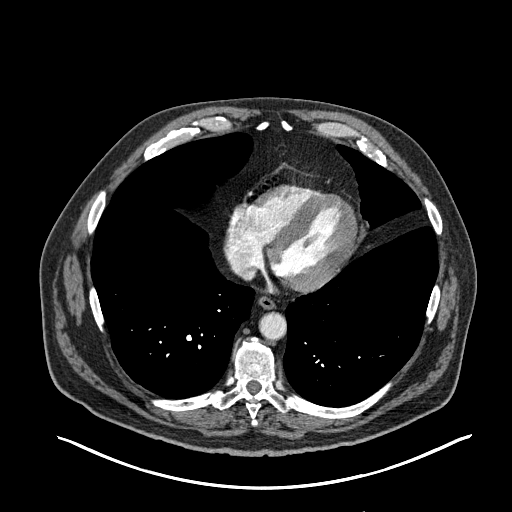
[im 110/238  soft-tissue]
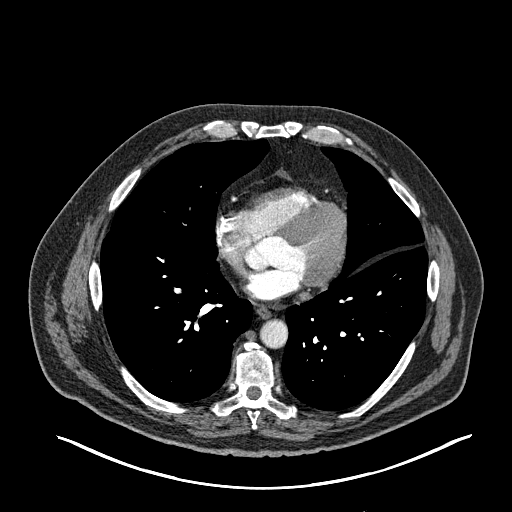
[im 128/238  soft-tissue]
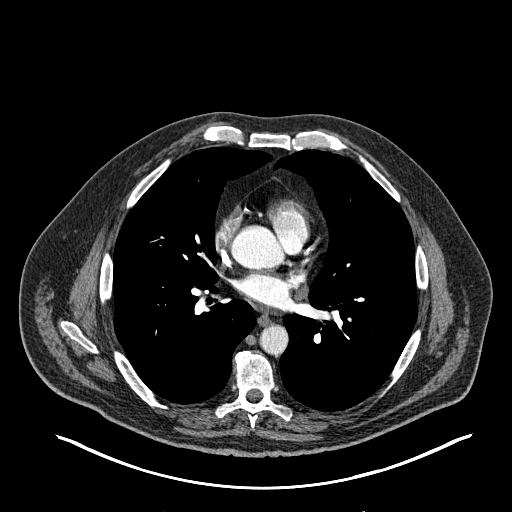
[im 146/238  soft-tissue]
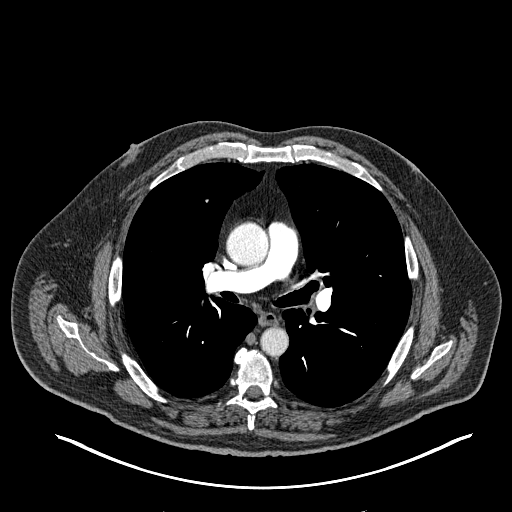
[im 165/238  soft-tissue]
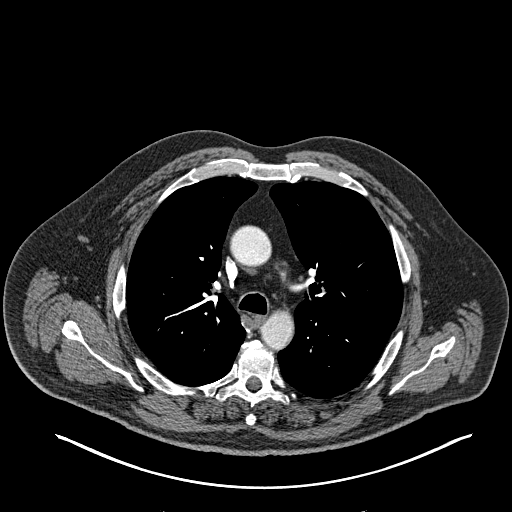
[im 165/238  lung]
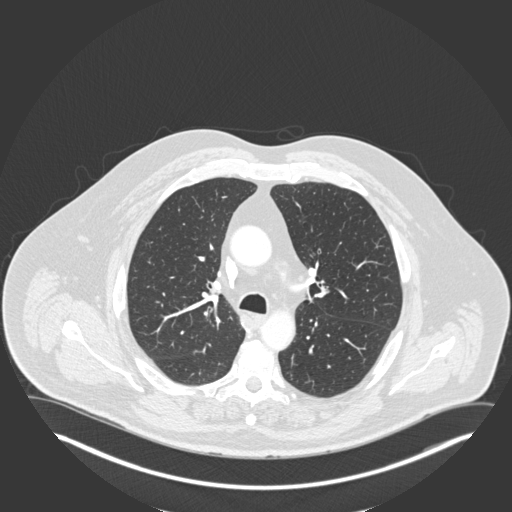
[im 165/238  bone]
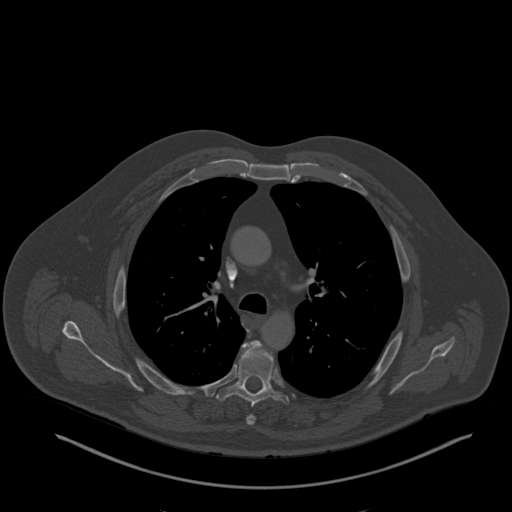
[im 183/238  soft-tissue]
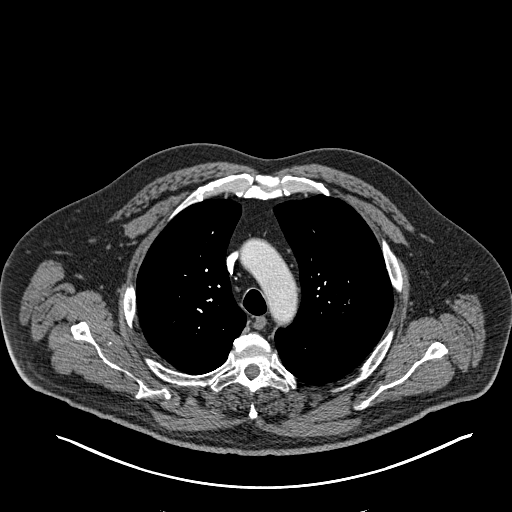
[im 183/238  lung]
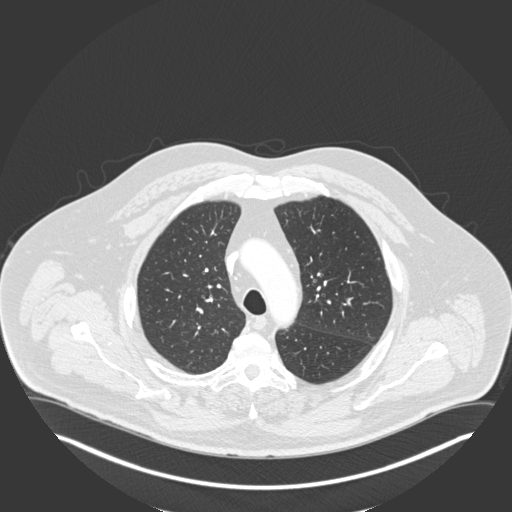
[im 201/238  soft-tissue]
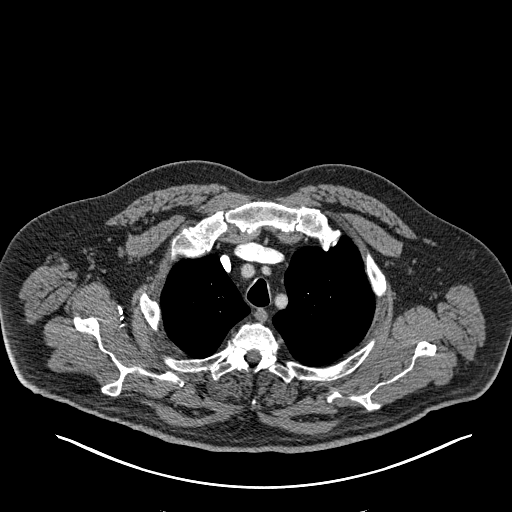
[im 201/238  lung]
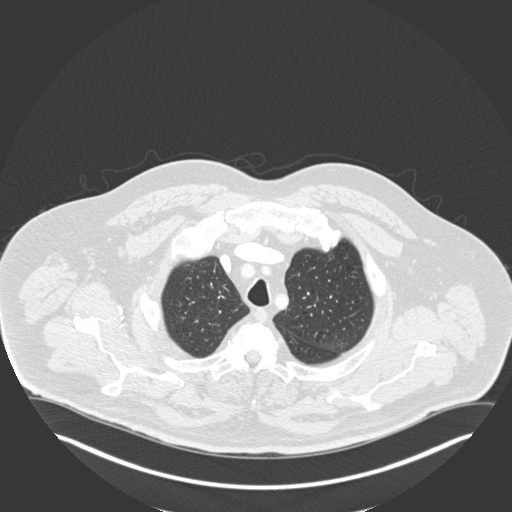
[im 219/238  soft-tissue]
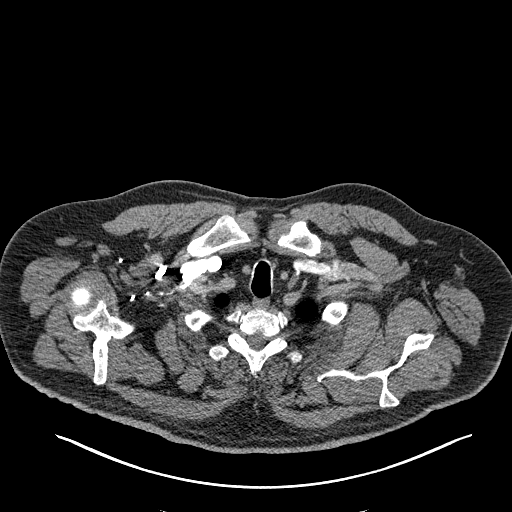
[im 219/238  lung]
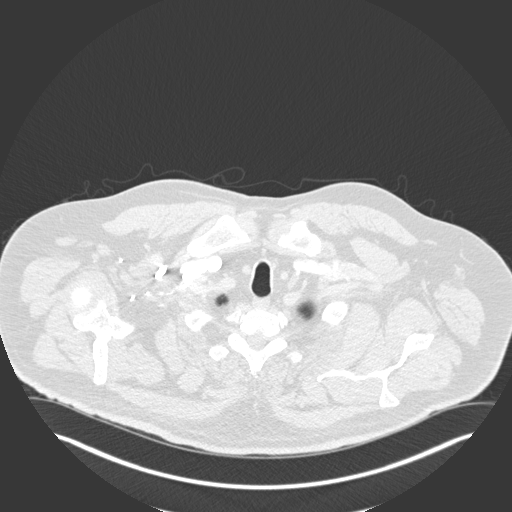

[12 of 32 positions shown; findings below may reference images not displayed]

FINDINGS: PULMONARY ARTERIES: No pulmonary emboli have resolved compared to the previous 
exam. No acute pulmonary embolus. Main pulmonary artery and right left pulmonary 
arteries are of normal caliber. 
LUNGS AND PLEURA: Mild peribronchial thickening with scattered areas of 
atelectasis but no evidence of CTEPH, acute consolidation or effusion.   No 
effusions.   
MEDIASTINUM: No masses.  
LYMPH NODES: No adenopathy. 
HEART: Normal in size. No evidence of ventricular strain. Moderate coronary 
artery calcification. No pericardial effusion. Mild fusiform dilatation 
ascending aorta measuring 4 cm. 
AORTA AND GREAT VESSELS: No aneurysm or dissection. 
OSSEOUS STRUCTURES: No acute fracture or destructive lesion.   
UPPER ABDOMEN: Portions of the abdomen included are normal.
IMPRESSION: Resolution of the previously noted pulmonary emboli. No evidence of acute 
pulmonary embolus. 
No evidence of right heart strain. 
Pulmonary arteries are of normal caliber. 
Mild fusiform dilatation ascending aorta. 
Mild peribronchial thickening with scattered areas of atelectasis but no 
evidence of CTEPH, acute consolidation or effusion. 
RADIATION DOSE REDUCTION: All CT scans are performed using radiation dose 
reduction techniques, when applicable.  Technical factors are evaluated and 
adjusted to ensure appropriate moderation of exposure.  Automated dose 
management technology is applied to adjust the radiation doses to minimize 
exposure while achieving diagnostic quality images.

## 2021-08-20 IMAGING — MR MRI LEFT KNEE WITH AND WITHOUT CONTRAST
4 of 7 series · 20 of 40 positions shown · IV contrast (gadolinium)
Comparison: Radiographs of 07/10/2021.

________________________________________________________________________________________________ 
MRI LEFT KNEE WITH AND WITHOUT CONTRAST, 08/20/2021 [DATE]: 
CLINICAL INDICATION: Left lateral knee pain. Swelling. Patient is on Coumadin. 
Concern for DJD..
TECHNIQUE: Multiplanar, multiecho position MR images of the knee were performed 
without and with intravenous gadolinium enhancement. 11.5 mL of Gadavist were 
injected intravenously by hand. 3.5 mL of Gadavist were discarded. Patient was 
scanned on a 1.5T magnet.

[Series 201: survey_ · axial · 10.0mm · 0.78mm/px · z∈[-20,+125]mm · 2 of 9 slices shown]
[im 1/9]
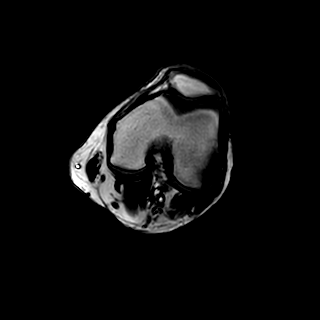
[im 9/9]
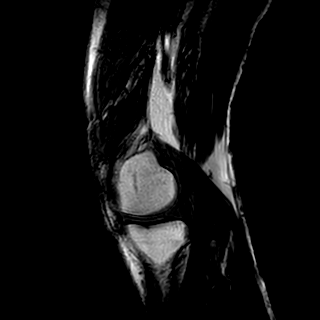

[Series 301: (person_name)_(person_name)_(person_name) · axial · 3.0mm · 0.36mm/px · z∈[-108,+22]mm · 7 of 38 slices shown]
[im 1/38]
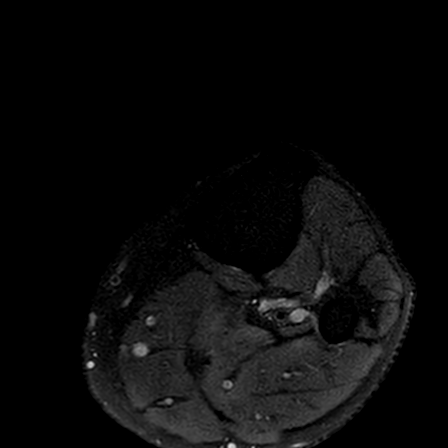
[im 7/38]
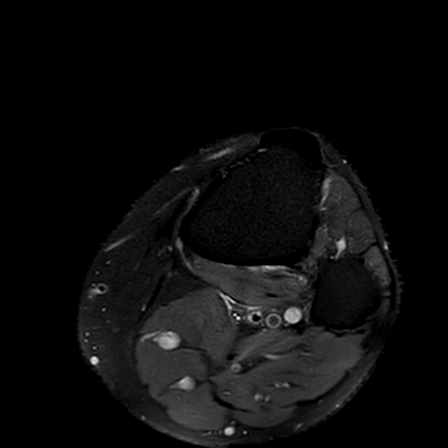
[im 13/38]
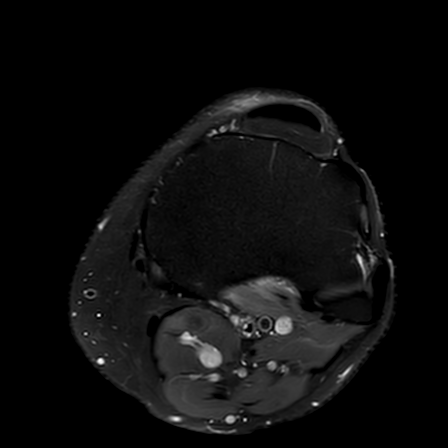
[im 19/38]
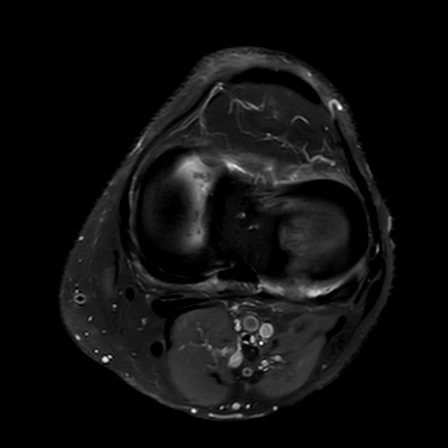
[im 25/38]
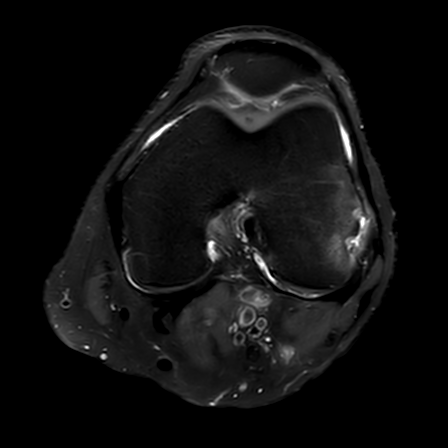
[im 31/38]
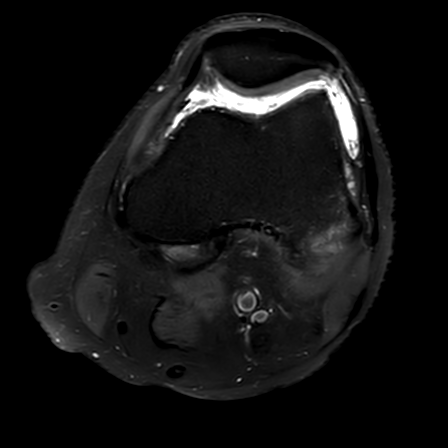
[im 38/38]
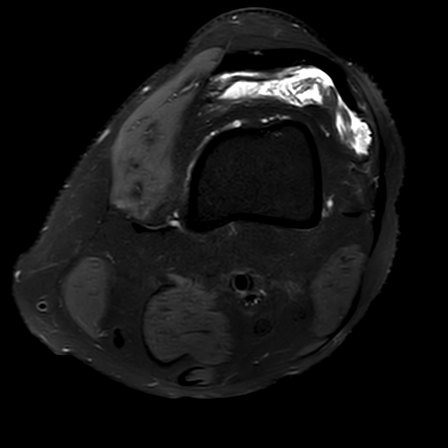

[Series 401: pd_fs_sag fh · sagittal · 3.0mm · 0.28mm/px · 6 of 35 slices shown]
[im 1/35]
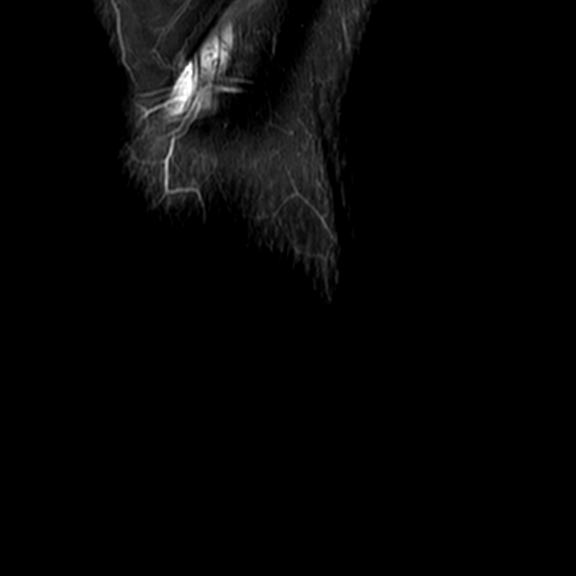
[im 7/35]
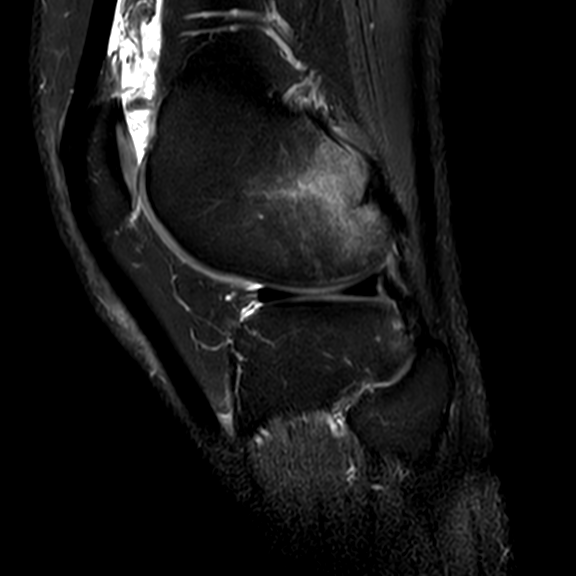
[im 14/35]
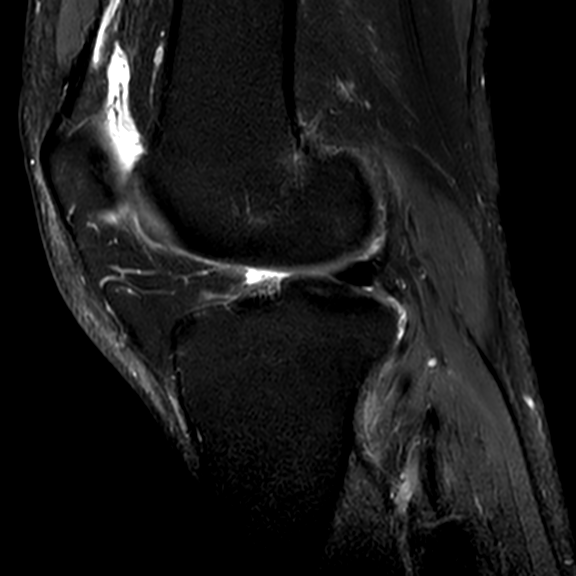
[im 21/35]
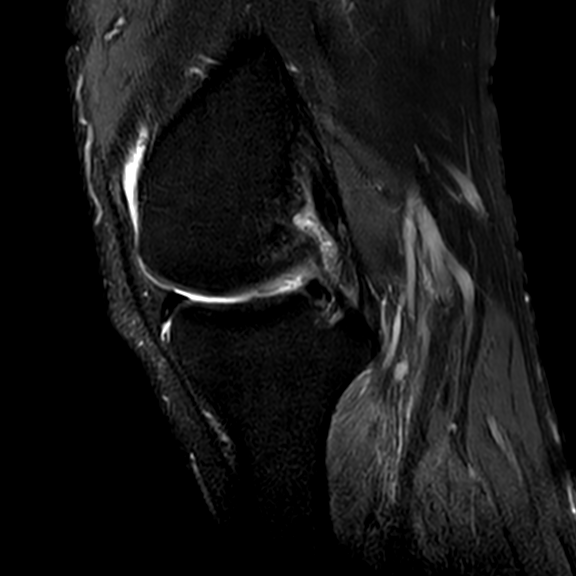
[im 28/35]
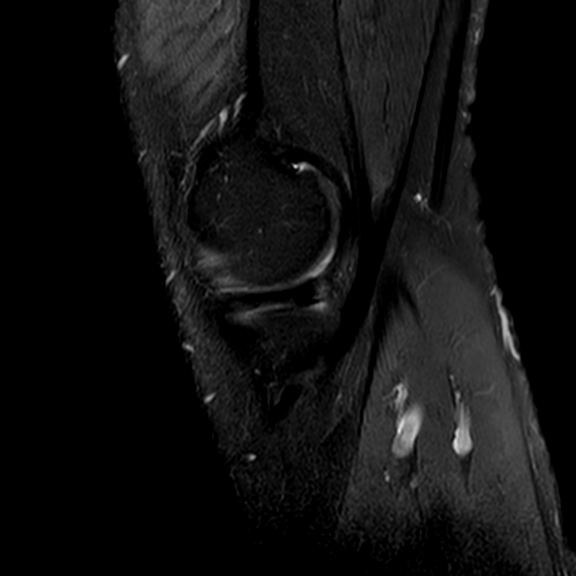
[im 35/35]
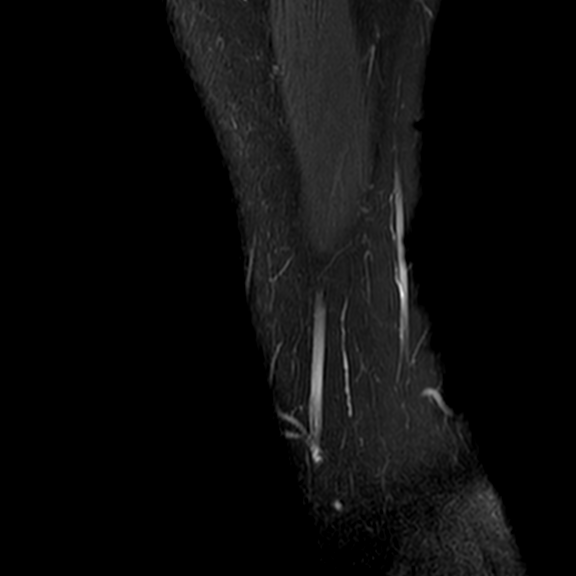

[Series 501: t1_cor · coronal · 3.0mm · 0.31mm/px · 5 of 36 slices shown]
[im 1/36]
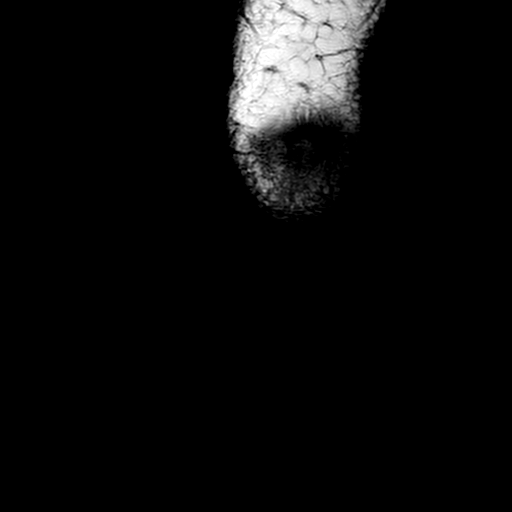
[im 8/36]
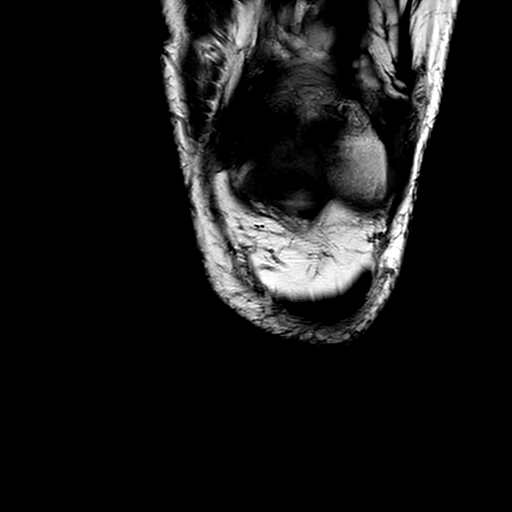
[im 15/36]
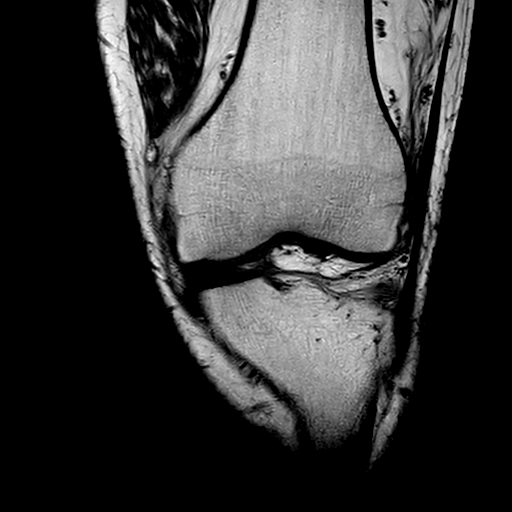
[im 22/36]
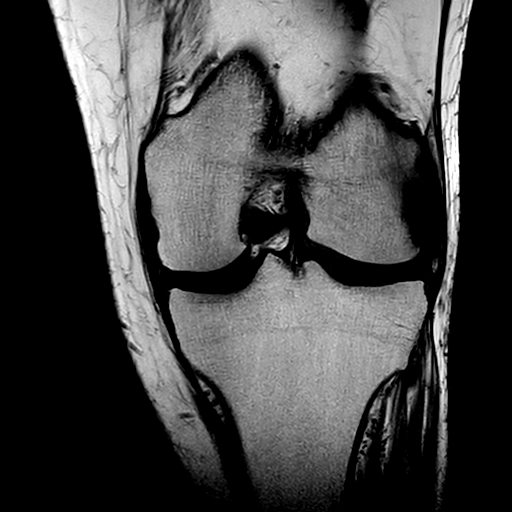
[im 36/36]
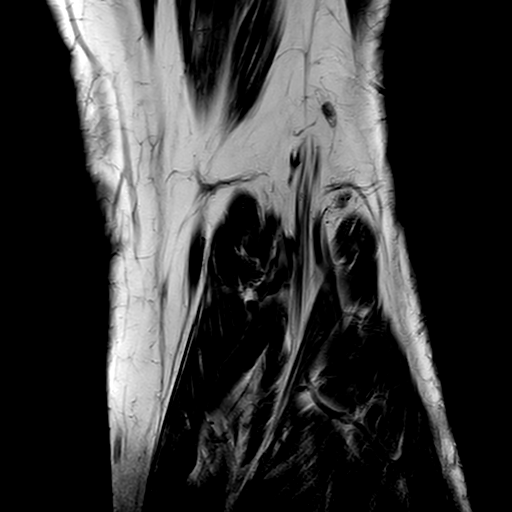

[20 of 40 positions shown; findings below may reference images not displayed]

FINDINGS: MEDIAL COMPARTMENT: Small undersurface cleavage tear involving the periphery of 
the posterior body and posterior horn of the medial meniscus. No flap 
displacement or peripheral extrusion. Articular cartilage is preserved. 
LATERAL COMPARTMENT: The lateral meniscus is intact without tear or extrusion. 
Articular cartilage is preserved. 
PATELLOFEMORAL COMPARTMENT: Moderate articular cartilaginous loss, grade 3 with 
chondral fissuring and prominent chondral surface irregularity of the patella.  
TIBIOFIBULAR COMPARTMENT: Negative. 
LIGAMENTS: The anterior cruciate ligament is intact. The posterior cruciate 
ligament is intact. The medial collateral ligament and lateral collateral 
ligaments are preserved. 
EXTENSOR MECHANISM: The quadriceps and patellar tendon are preserved. The medial 
and lateral retinacula are intact. 
POSTEROMEDIAL CORNER: The semimembranosus and pes anserine tendons are 
preserved. The posterior oblique ligament and posterior medial joint capsule are 
intact. 
POSTEROLATERAL CORNER: There is thickening with signal abnormality with 
tendinosis of the proximal popliteal tendon. Biceps femoris is intact. 
ADDITIONAL FINDINGS: Small knee joint effusion with synovial enhancement with 
synovitis. There is marrow edema with increased enhancement suggestive for bone 
contusion or stress response involving the superolateral periphery of the 
lateral femoral condyle. There is mild edema with mild strain of the proximal 
popliteal muscle. Marrow edema subjacent to the origin of the popliteal tendon. 
Subcutaneous tissues are negative. Neurovascular bundles are negative.
IMPRESSION: 1.  No marrow edema to suggest for bone contusion or stress response a 
moderately superolateral periphery of the lateral femoral condyle. 
2.  Mild popliteal strain and mild popliteal tendinosis. 
3.  Subtle small undersurface cleavage tear of the periphery of the medial 
meniscal body and posterior horn. 
4.  Moderate chondromalacia of the patella. 
5.  Small knee joint effusion with synovitis.

## 2021-08-22 IMAGING — MR MRI RIGHT KNEE WITH AND WITHOUT CONTRAST
5 of 7 series · 26 of 40 positions shown · IV contrast (gadolinium)
Comparison: 07/10/2021 radiographs

________________________________________________________________________________________________ 
MRI RIGHT KNEE WITH AND WITHOUT CONTRAST, 08/22/2021 [DATE]: 
CLINICAL INDICATION: Right knee pain, right knee cyst, concern for DJD, 
persistent pain.
TECHNIQUE: Multiplanar, multiecho position MR images of the knee were performed 
without and with intravenous gadolinium enhancement. 11.5 mL of Gadavist were 
injected intravenously by hand. 3.5 mL of Gadavist were discarded. Patient was 
scanned on a 1.5T magnet.

[Series 201: survey_ · axial · 10.0mm · 0.78mm/px · z∈[-20,+125]mm · 2 of 9 slices shown]
[im 1/9]
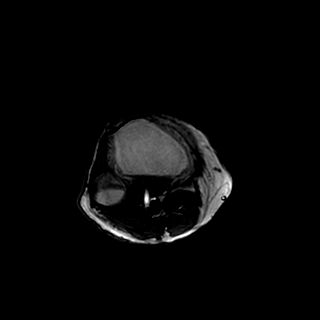
[im 9/9]
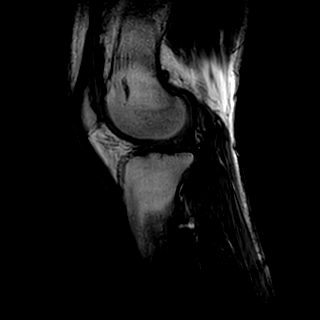

[Series 301: (person_name)_(person_name)_(person_name) · axial · 3.0mm · 0.36mm/px · z∈[-46,+77]mm · 7 of 36 slices shown]
[im 1/36]
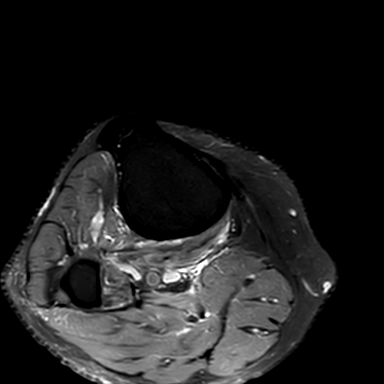
[im 6/36]
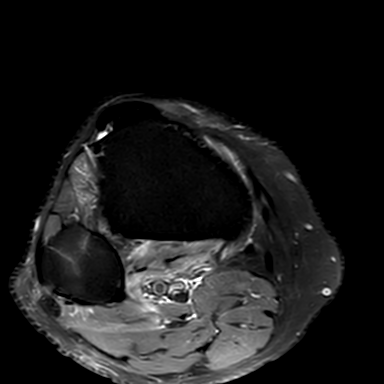
[im 12/36]
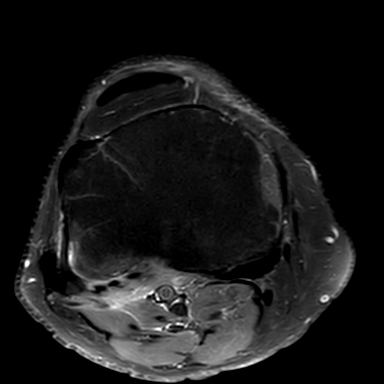
[im 18/36]
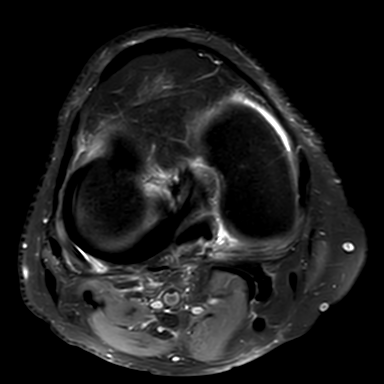
[im 24/36]
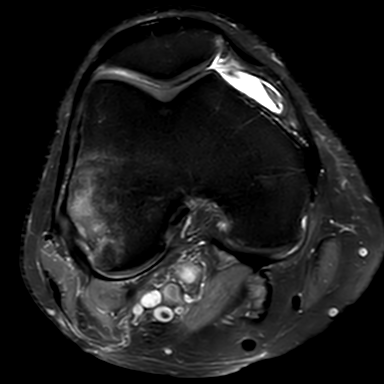
[im 30/36]
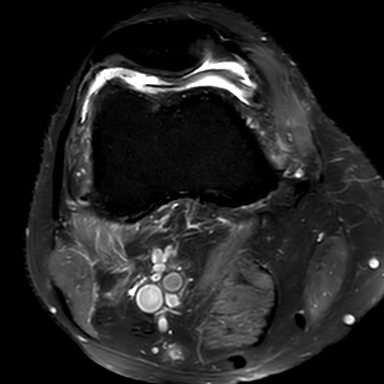
[im 36/36]
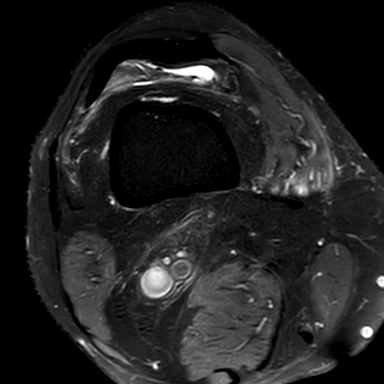

[Series 401: pd_fs_sag fh · sagittal · 3.0mm · 0.29mm/px · 6 of 32 slices shown]
[im 1/32]
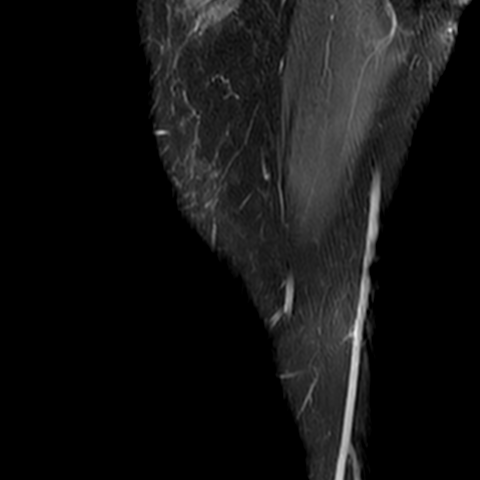
[im 7/32]
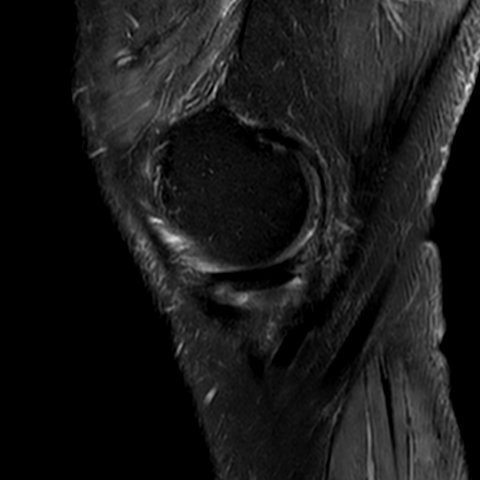
[im 13/32]
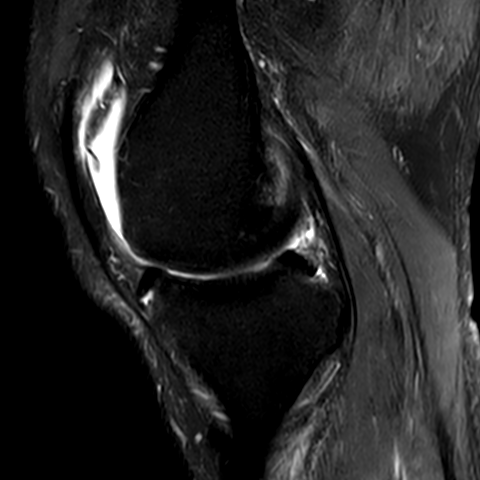
[im 19/32]
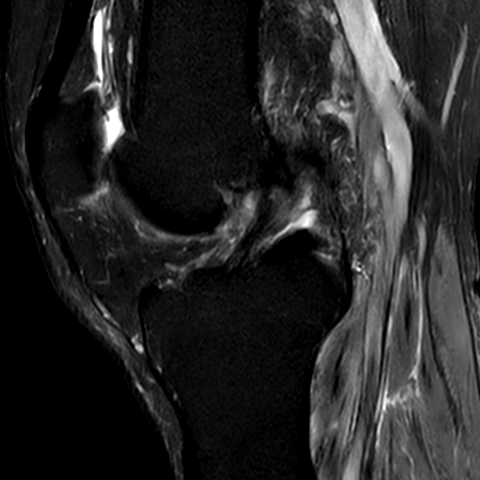
[im 25/32]
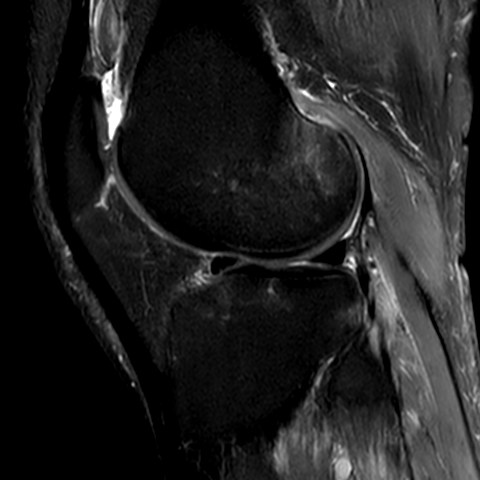
[im 32/32]
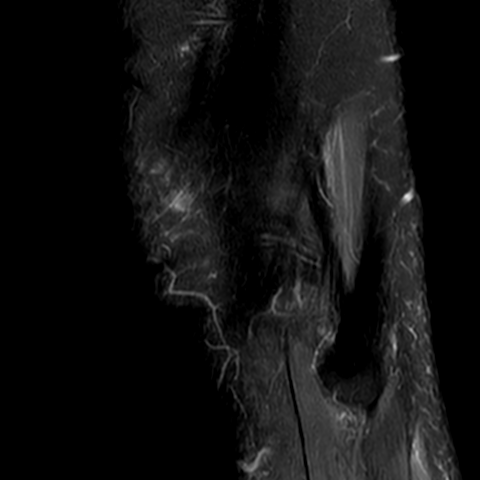

[Series 501: t1_cor · coronal · 3.0mm · 0.31mm/px · 6 of 32 slices shown]
[im 1/32]
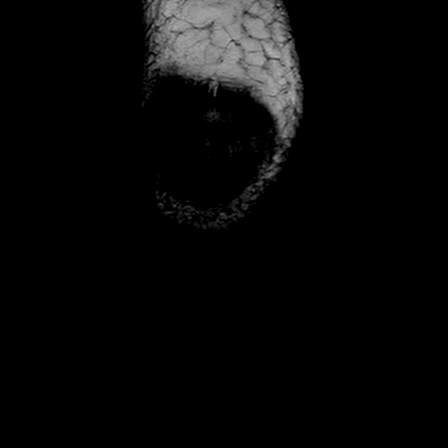
[im 7/32]
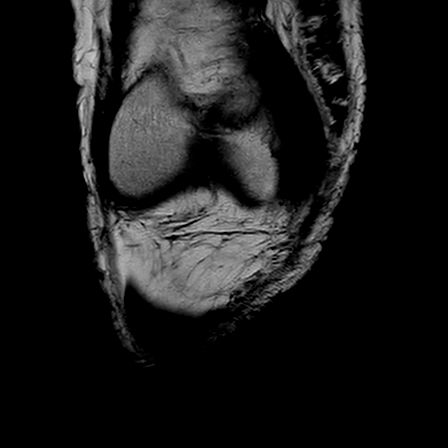
[im 13/32]
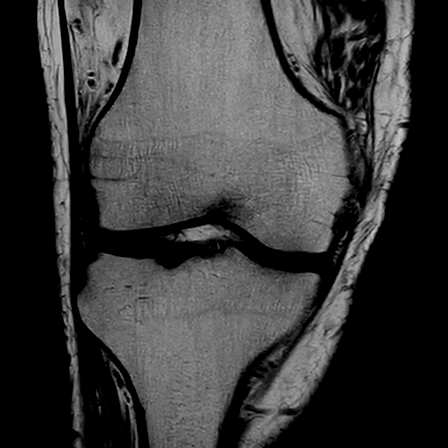
[im 19/32]
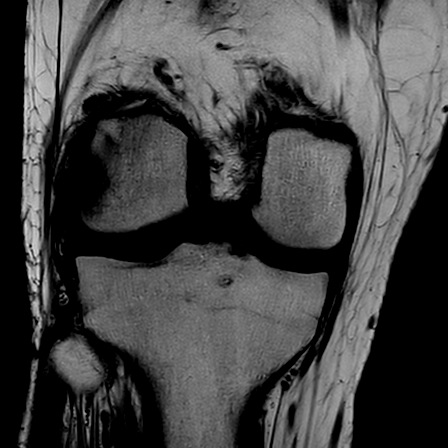
[im 25/32]
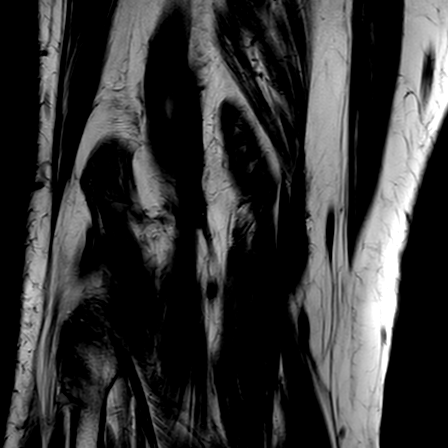
[im 32/32]
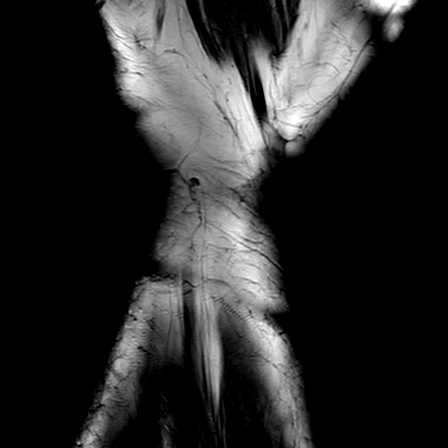

[Series 601: pd_fs_cor · coronal · 3.0mm · 0.31mm/px · 5 of 32 slices shown]
[im 1/32]
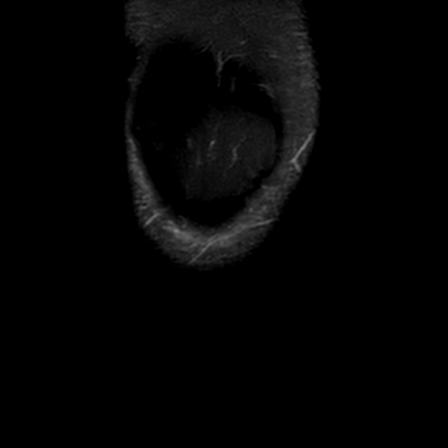
[im 7/32]
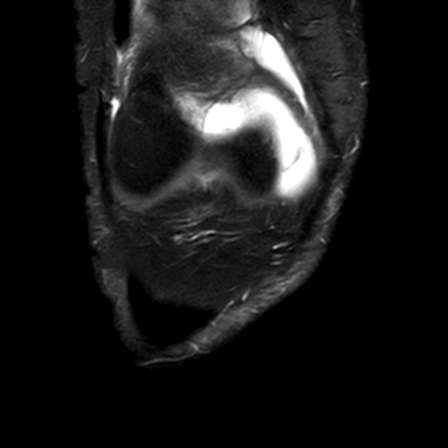
[im 13/32]
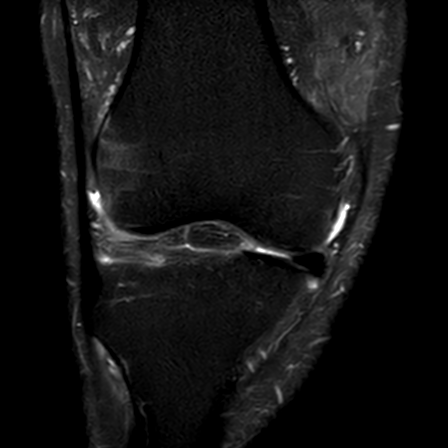
[im 19/32]
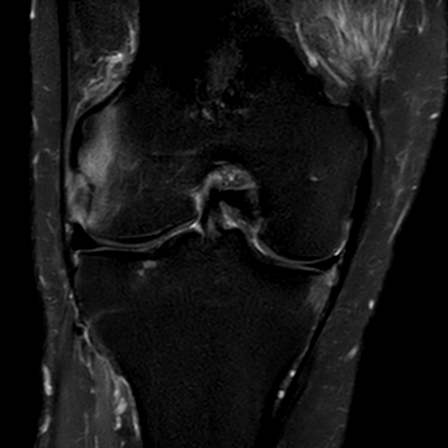
[im 25/32]
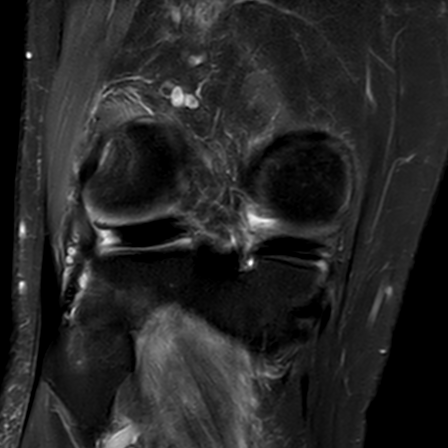

[26 of 40 positions shown; findings below may reference images not displayed]

FINDINGS: MEDIAL COMPARTMENT: Horizontal tear of the posterior horn of the medial 
meniscus. No medial meniscal extrusion. Articular cartilage is preserved. 
LATERAL COMPARTMENT: The lateral meniscus is intact without tear or extrusion. 
Up to grade II chondromalacia of the medial patellar facet. Up to grade II 
chondromalacia of the remaining patella. Grade IV cartilage fissure of the 
central trochlea with minimal subcortical cystic change.  
PATELLOFEMORAL COMPARTMENT: The patella is centrally located. Up to grade III 
chondromalacia.  
TIBIOFIBULAR COMPARTMENT: Negative. 
LIGAMENTS: The anterior cruciate ligament is intact. The posterior cruciate 
ligament is intact. The medial collateral ligament and lateral collateral 
ligaments are preserved. 
EXTENSOR MECHANISM: The quadriceps and patellar tendon are preserved. The medial 
and lateral retinacula are intact. 
POSTEROMEDIAL CORNER: The semimembranosus and pes anserine tendons are 
preserved. The posterior oblique ligament and posterior medial joint capsule are 
intact. 
POSTEROLATERAL CORNER: The popliteal tendon and popliteofibular ligament are 
intact. The biceps femoris is negative. 
BONES: No fracture. Erosive change of the lateral margin of the lateral femoral 
condyle along the popliteal fossa measures up to 1.9 cm with subjacent marrow 
edema/enhancement. 0.5 cm erosion in the medial tibial rim with subjacent marrow 
edema/enhancement. No gouty tophi identified. Mild marrow edema/enhancement in 
the fibular head and adjacent proximal lateral tibia. 
ADDITIONAL FINDINGS: Small knee joint effusion and synovitis. Thickened 
suprapatellar plica measures up to 3 mm in thickness. No popliteal cyst. Mild 
edema-like signal changes and enhancement in the popliteus muscle and edema in 
the anterior tibialis/proximal soleus muscles. Subcutaneous tissues are 
negative. Neurovascular bundles are negative.
IMPRESSION: 1.  Horizontal tear of the posterior horn of the medial meniscus.  
2.  Lateral femoral condylar and medial tibial rim erosions with subjacent 
marrow edema, consistent with erosive arthritis. 
3.  Moderate patellofemoral joint chondromalacia. 
4.  Mild fibular head/proximal lateral tibia marrow edema. 
5.  Small knee joint effusion, synovitis and thickened suprapatellar plica. 
6.  Mild muscle edema/enhancement is likely reactive.

## 2022-06-18 IMAGING — CT CTA CHEST
2 of 4 series · 15 of 46 positions shown, 17 images · IV contrast (APPLIED)
Comparison: CTA chest [DATE] [DATE], [DATE], [DATE] [DATE], [DATE] and CT chest Febres 20th 
1413.

________________________________________________________________________________________________ 
CTA CHEST, 06/18/2022 [DATE]: 
CLINICAL INDICATION: Aortic aneurysm. Hypertension. Yearly examination. 
A search for DICOM formatted images was conducted for prior CT imaging studies 
completed at a non-affiliated media free facility.
TECHNIQUE: The chest was scanned from base of neck through the lung bases with 
100 mL of Isovue 370 MDV injected intravenously on a high resolution low dose CT 
scanner using dose reduction techniques. Routine MPR and MIP 3D renderings were 
reconstructed on an independent workstation with concurrent physician 
supervision. The patients eGFR was calculated to be 47.1 mL/min/1.73 m2 using 
the i-STAT device.

[Series 4: cta chest 2.0 i31s 3 · axial · 0.88mm/px · z∈[-354,-60]mm · 12 of 168 slices shown, 14 images]
[im 14/168  soft-tissue]
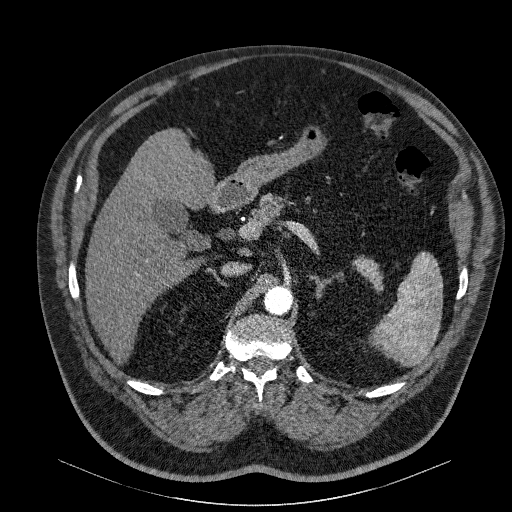
[im 14/168  bone]
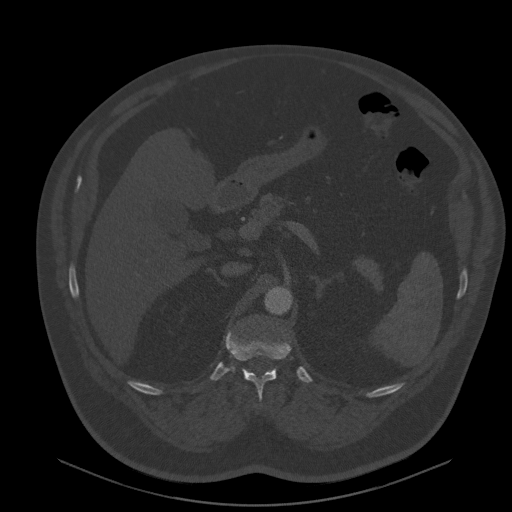
[im 27/168  soft-tissue]
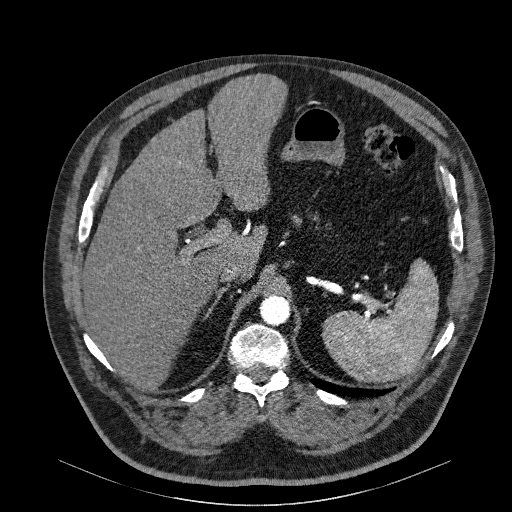
[im 41/168  soft-tissue]
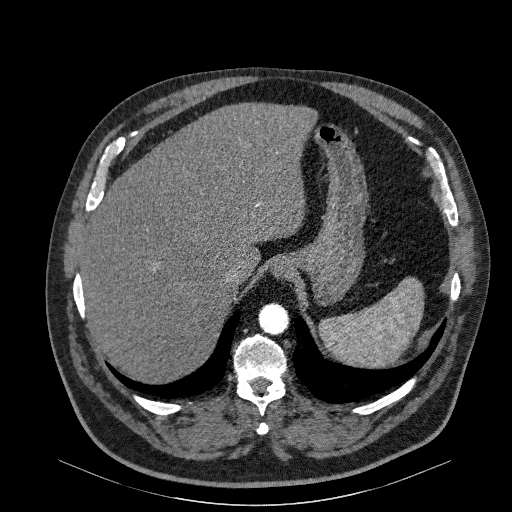
[im 54/168  soft-tissue]
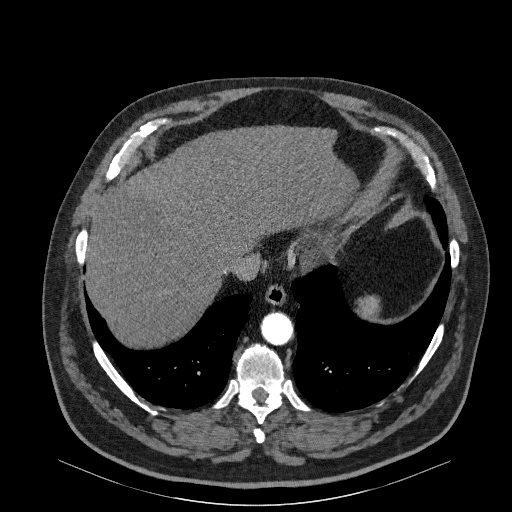
[im 67/168  soft-tissue]
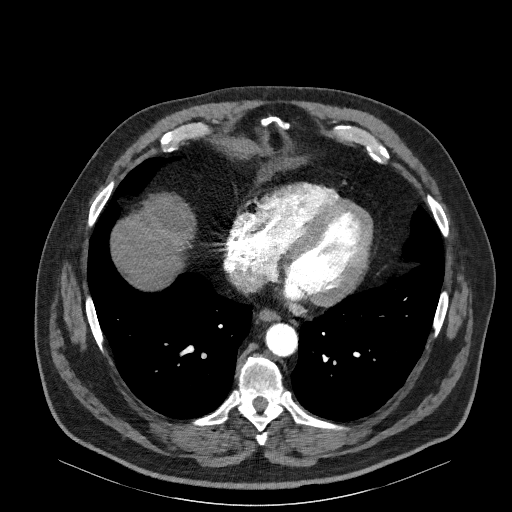
[im 81/168  soft-tissue]
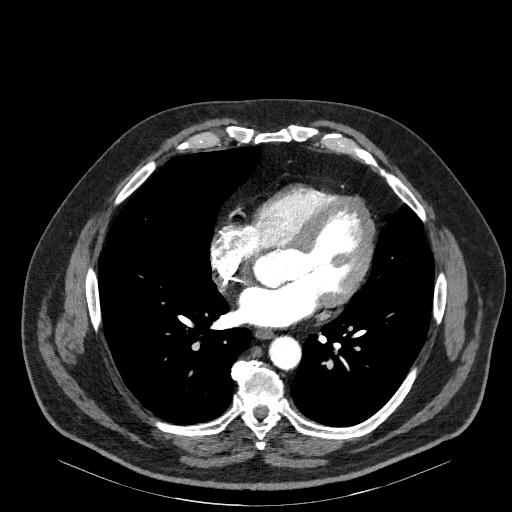
[im 94/168  soft-tissue]
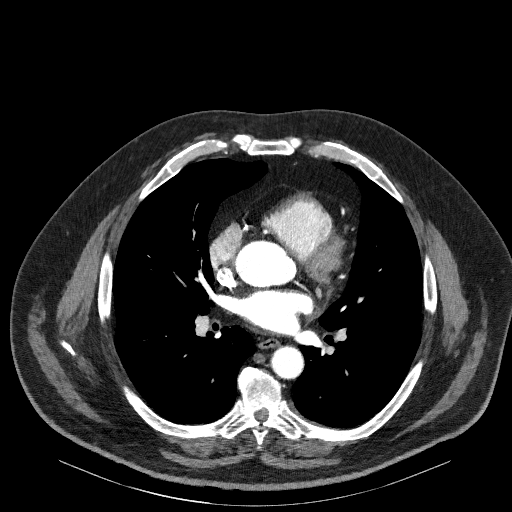
[im 107/168  soft-tissue]
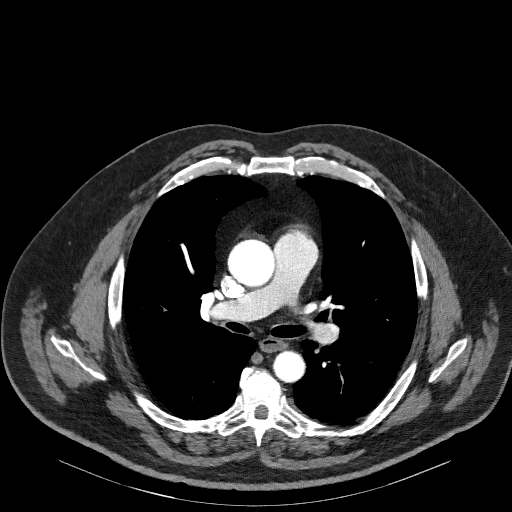
[im 121/168  soft-tissue]
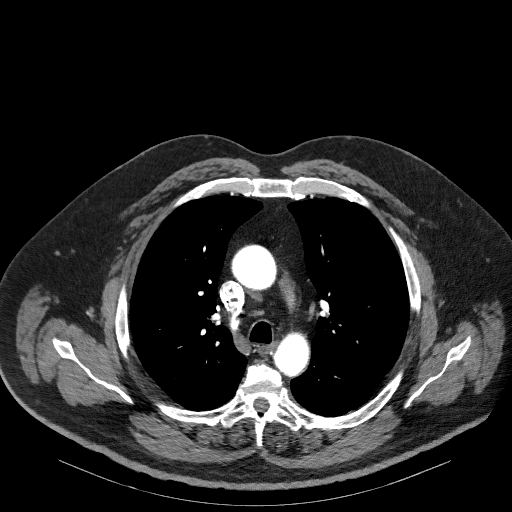
[im 121/168  bone]
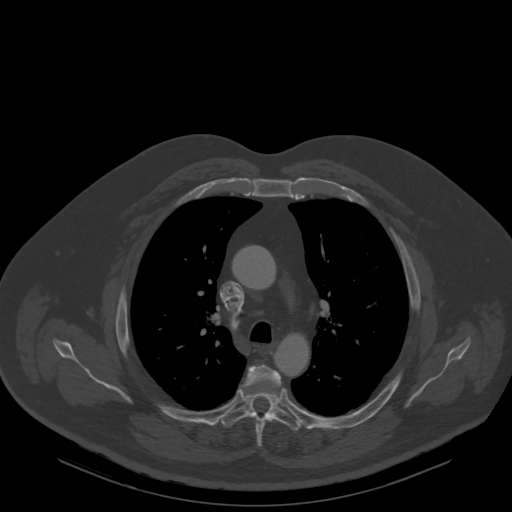
[im 134/168  soft-tissue]
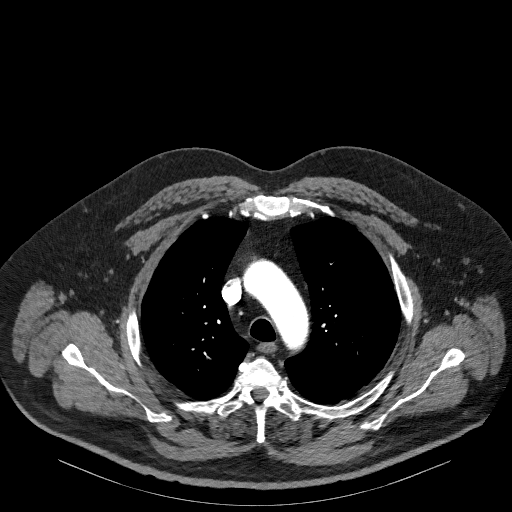
[im 147/168  soft-tissue]
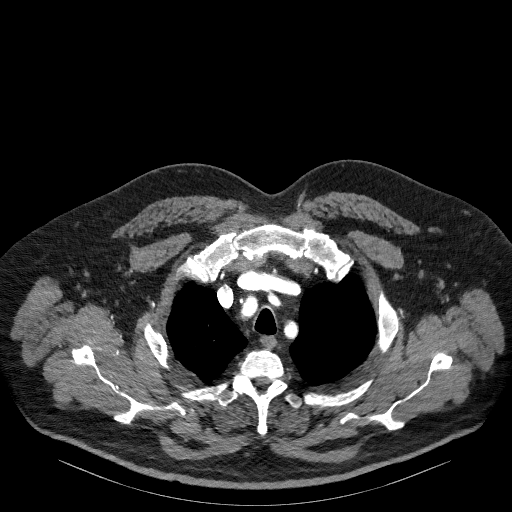
[im 161/168  soft-tissue]
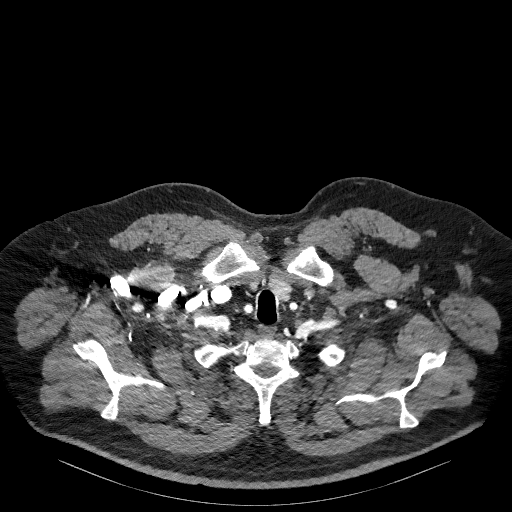

[Series 8: coronal · coronal · 0.67mm/px · 3 of 180 slices shown]
[im 60/180  soft-tissue]
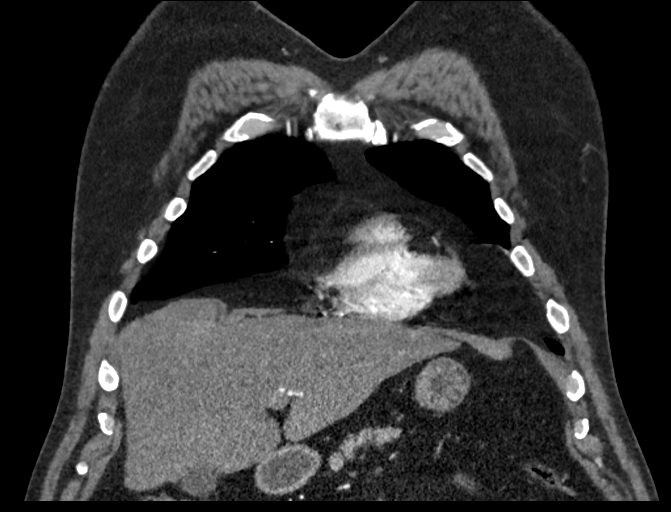
[im 80/180  soft-tissue]
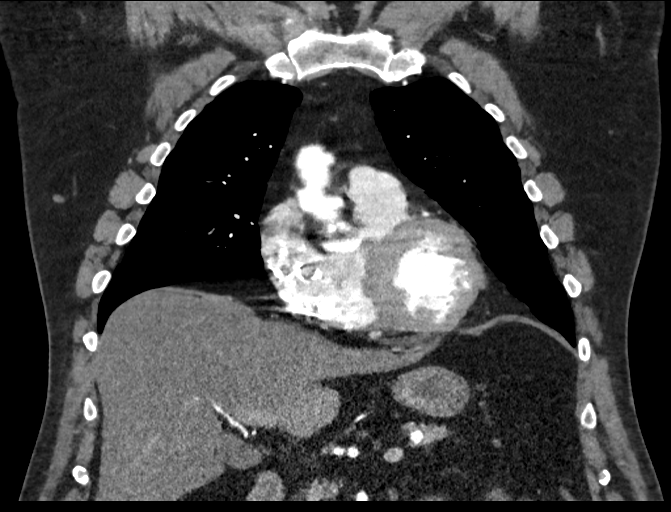
[im 100/180  soft-tissue]
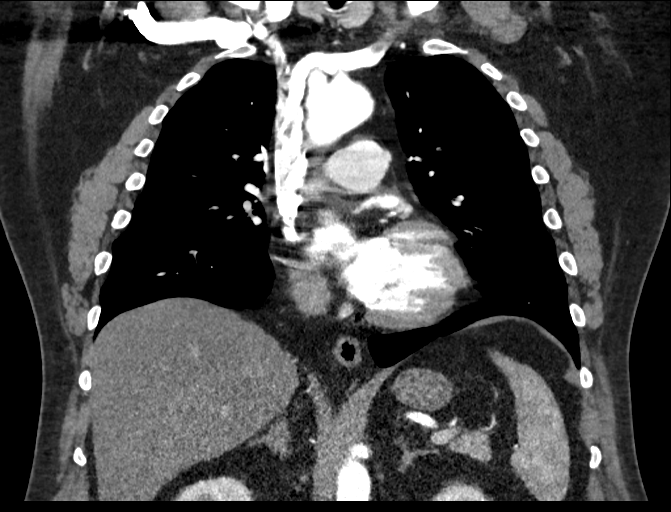

[15 of 46 positions shown; findings below may reference images not displayed]

Comparison made to oldest available CT chest June 08, 2017.  
Count of known CT and Cardiac Nuclear Medicine studies performed in the previous 
12 months = 3.
FINDINGS: PULMONARY ARTERIES: The bolus is somewhat suboptimal for the pulmonary arteries, 
although there is no evidence of acute or chronic pulmonary embolus. The main 
pulmonary artery is within normal limits for size. 
LUNGS AND PLEURA: Mild peribronchial thickening is suggestive of a component of 
bronchitis. The lungs are clear. No infiltrate, effusion, or evidence of edema. 
No pulmonary nodule.   No effusions.   
MEDIASTINUM: No masses.  
LYMPH NODES: No adenopathy. 
HEART: Normal in size.  No pericardial effusion. Mild to moderate coronary 
artery calcification. 
AORTA AND GREAT VESSELS: Mild aneurysmal dilatation of the ace ascending 
thoracic aorta. It measures 4.2 cm AP by 4.1 cm transverse at the level the 
right pulmonary artery. An oldest available study June 08, 2017, at a similar 
level, the eighth ascending thoracic aorta measured 4.1 cm AP by 4.1 cm 
transverse, stable. No aortic dissection. Aortic arch and great vessel origins 
are unremarkable. Descending thoracic aorta unremarkable. 
OSSEOUS STRUCTURES: No acute fracture or destructive lesion.   
UPPER ABDOMEN: Diffuse low-attenuation throughout the liver suggests a component 
of hepatic steatosis.
IMPRESSION: No evidence of acute or chronic pulmonary emboli. Previously noted pulmonary 
emboli have resolved. 
Stable small size a sending thoracic aortic aneurysm, stable to the oldest 
available study from May 2017. 
Mild peribronchial thickening is suggestive of a component of bronchitis. Lungs 
otherwise clear. 
In patients between the ages of 50-77 where pulmonary emphysema is noted on CT, 
recommend evaluation for low dose lung cancer screening protocol if patient is 
not already enrolled; as pulmonary emphysema is an independent risk factor for 
lung cancer. 
RADIATION DOSE REDUCTION: All CT scans are performed using radiation dose 
reduction techniques, when applicable.  Technical factors are evaluated and 
adjusted to ensure appropriate moderation of exposure.  Automated dose 
management technology is applied to adjust the radiation doses to minimize 
exposure while achieving diagnostic quality images.

## 2023-03-25 IMAGING — CT CTA CHEST
2 of 4 series · 15 of 46 positions shown, 17 images · IV contrast (isovue)
Comparison: CT 06/18/2022.

________________________________________________________________________________________________ 
CTA CHEST, 03/25/2023 [DATE]: 
CLINICAL INDICATION: Aortic aneurysm of unspecified site, without rupture 
A search for DICOM formatted images was conducted for prior CT imaging studies 
completed at a non-affiliated media free facility.
TECHNIQUE: The chest was scanned from base of neck through the lung bases with 
100 mL of Isovue 370 injected intravenously on a high resolution low dose CT 
scanner using dose reduction techniques. Routine MPR and MIP 3D renderings were 
reconstructed on an independent workstation with concurrent physician 
supervision. The patients eGFR was calculated to be 43.6 mL/min/1.73 m2 using 
the i-STAT device.

[Series 4: cta chest 2.0 i31s 3 · axial · 0.93mm/px · z∈[-375,-83]mm · 12 of 167 slices shown, 14 images]
[im 14/167  soft-tissue]
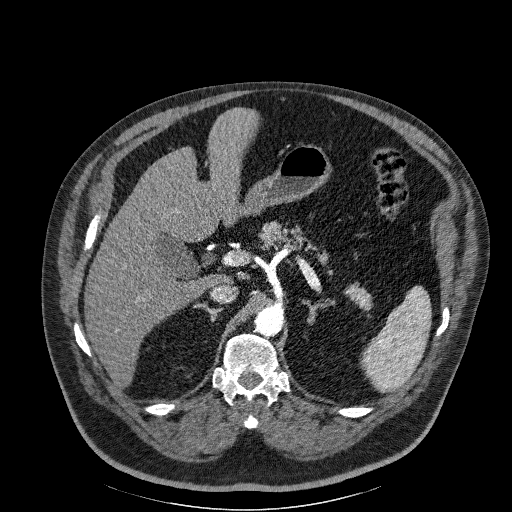
[im 14/167  bone]
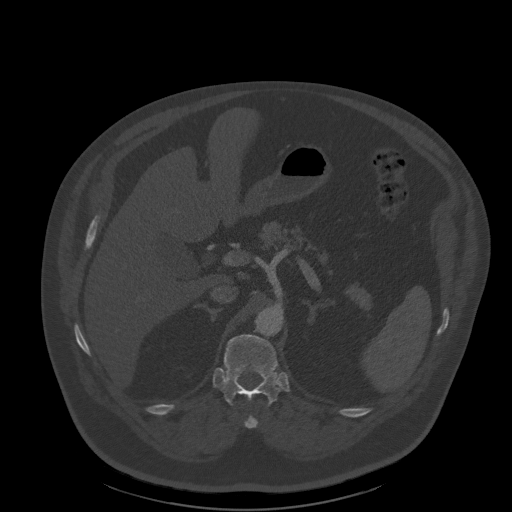
[im 27/167  soft-tissue]
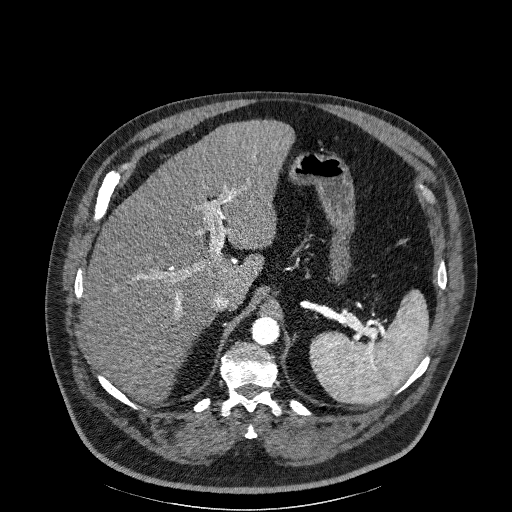
[im 40/167  soft-tissue]
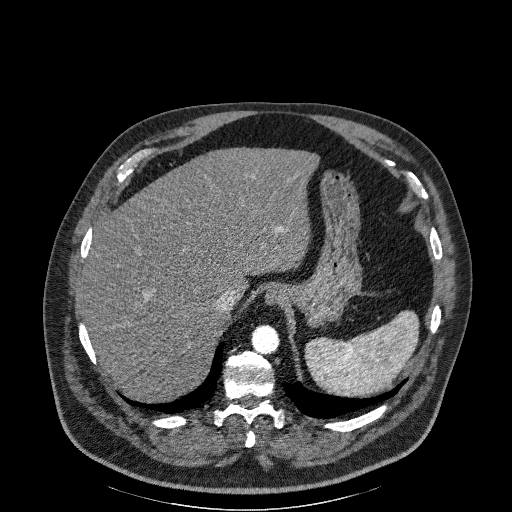
[im 54/167  soft-tissue]
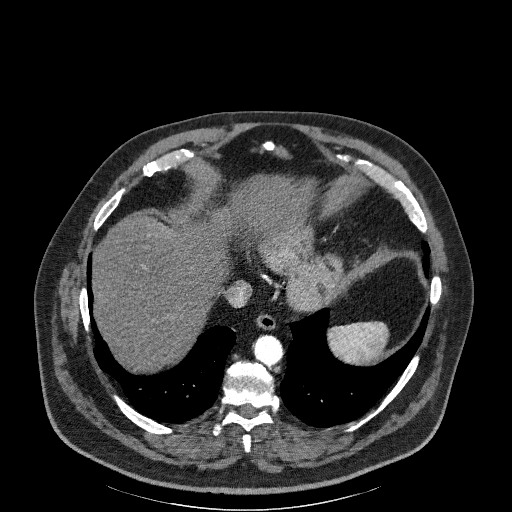
[im 67/167  soft-tissue]
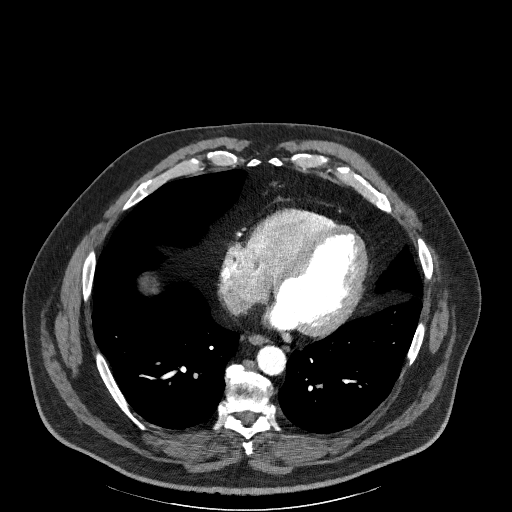
[im 80/167  soft-tissue]
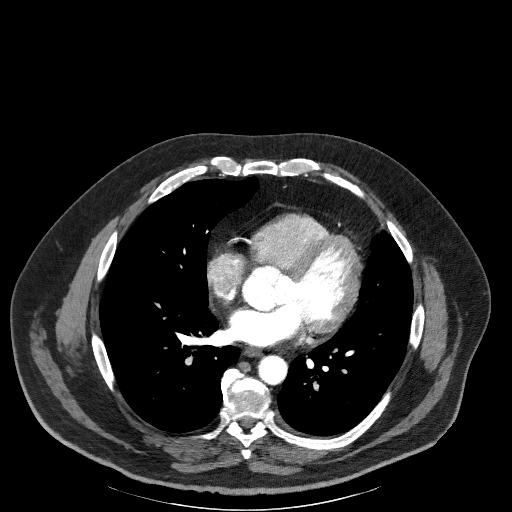
[im 93/167  soft-tissue]
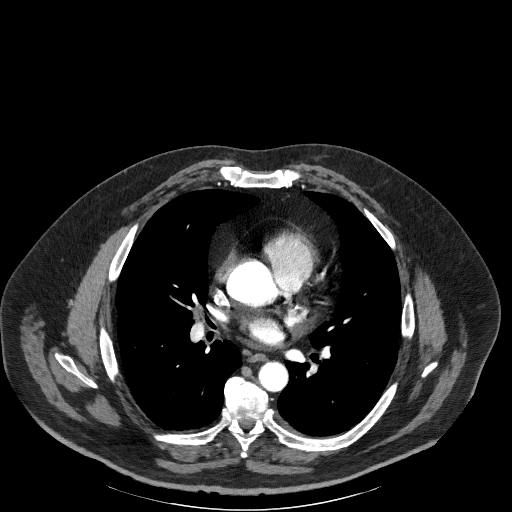
[im 107/167  soft-tissue]
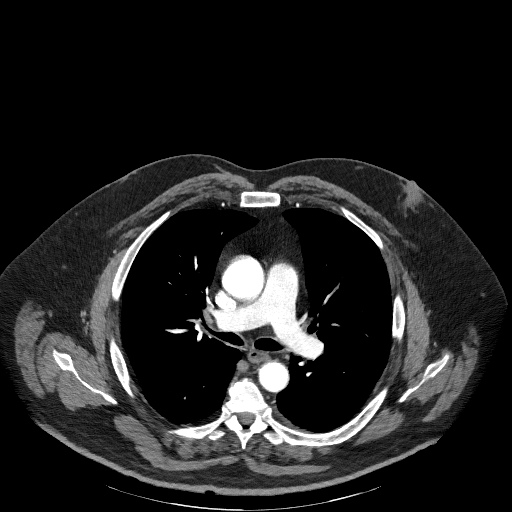
[im 120/167  soft-tissue]
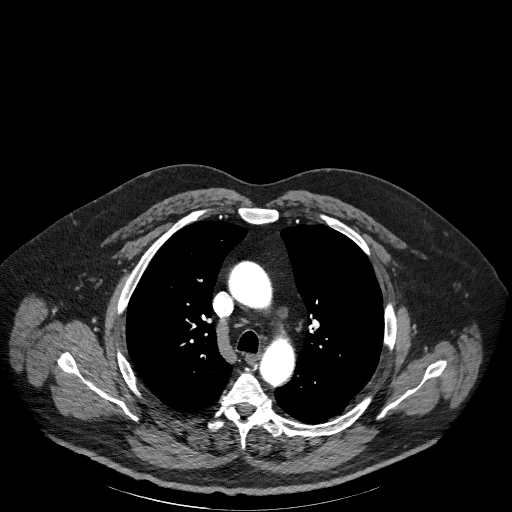
[im 120/167  bone]
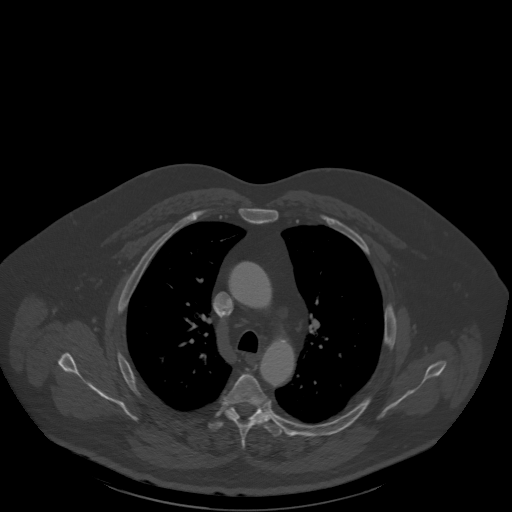
[im 133/167  soft-tissue]
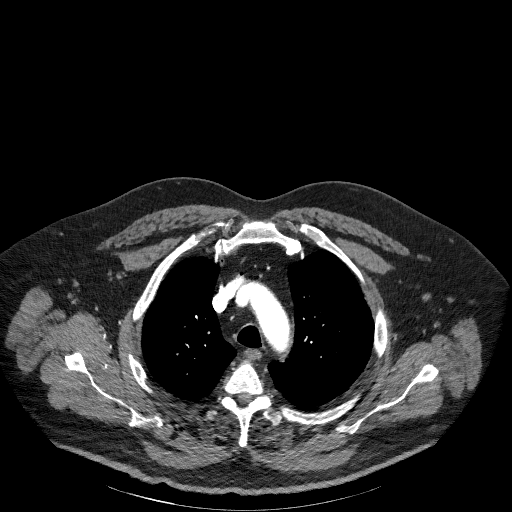
[im 147/167  soft-tissue]
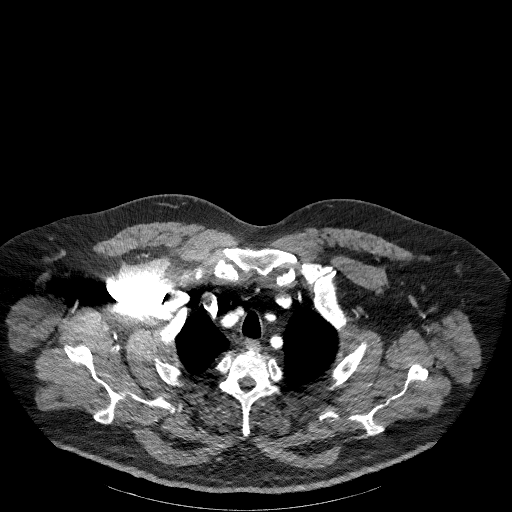
[im 160/167  soft-tissue]
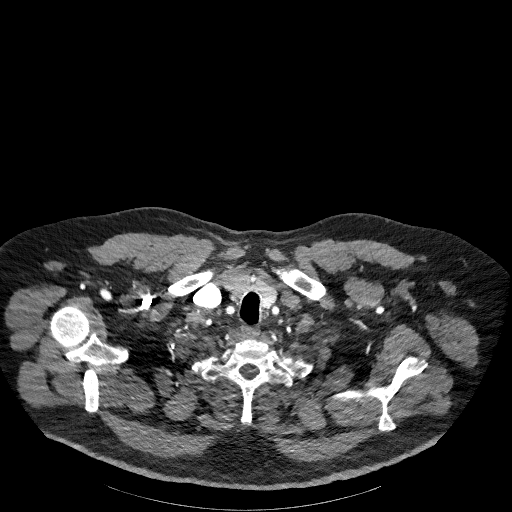

[Series 8: coronal · coronal · 0.65mm/px · 3 of 172 slices shown]
[im 58/172  soft-tissue]
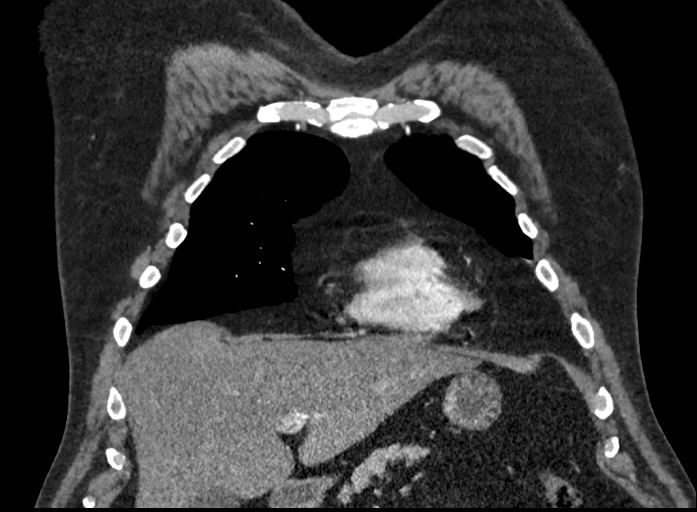
[im 77/172  soft-tissue]
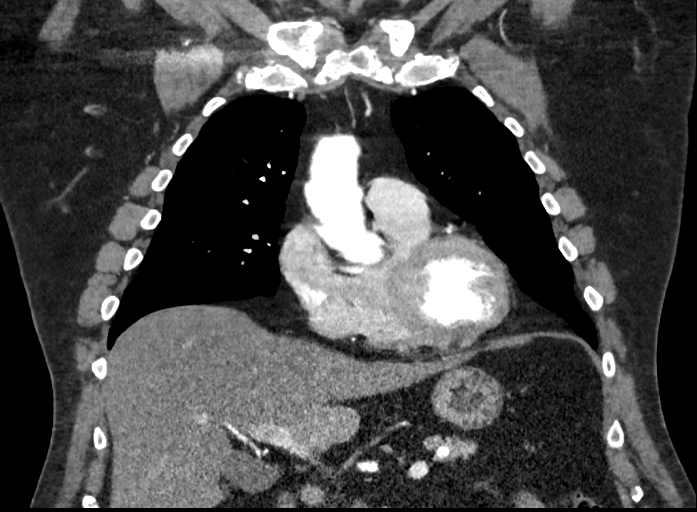
[im 96/172  soft-tissue]
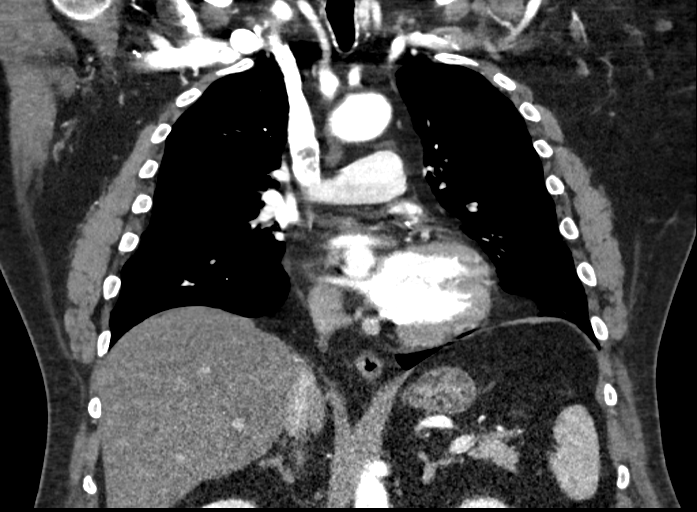

[15 of 46 positions shown; findings below may reference images not displayed]

Count of known CT and Cardiac Nuclear Medicine studies performed in the previous 
12 months = 1.
FINDINGS: LUNGS AND PLEURA: Mild peribronchial thickening but no focal bronchiectasis. No 
large area consolidation or effusion. No suspicious pulmonary nodule. 
MEDIASTINUM: No masses.  
LYMPH NODES: No adenopathy. 
HEART: Normal in size.  No pericardial effusion. Mild to moderate coronary 
artery calcification. No evidence of central pulmonary embolus. Majority IV 
contrast within the left heart and aorta. 
AORTA AND GREAT VESSELS: Stable aneurysmal dilatation ascending aorta measuring 
4.2 cm. No dissection. Aortic arch and great vessel origins are of normal 
caliber. Descending thoracic aorta is of normal caliber. 
OSSEOUS STRUCTURES: No acute fracture or destructive lesion.   
UPPER ABDOMEN: Fatty liver. Renal cysts..
IMPRESSION: No central pulmonary embolus. 
Stable fusiform dilatation ascending aorta measuring 4.2 cm. 
Peribronchial thickening but no focal bronchiectasis. No acute consolidation or 
effusion.. 
In patients between the ages of 50-77 where pulmonary emphysema is noted on CT, 
recommend evaluation for low dose lung cancer screening protocol if patient is 
not already enrolled; as pulmonary emphysema is an independent risk factor for 
lung cancer. 
RADIATION DOSE REDUCTION: All CT scans are performed using radiation dose 
reduction techniques, when applicable.  Technical factors are evaluated and 
adjusted to ensure appropriate moderation of exposure.  Automated dose 
management technology is applied to adjust the radiation doses to minimize 
exposure while achieving diagnostic quality images.
